# Patient Record
Sex: Female | Born: 1980
Health system: Southern US, Community
[De-identification: ages and names within clinical notes are randomized; demographics above are authoritative.]

## PROBLEM LIST (undated history)

## (undated) DIAGNOSIS — K589 Irritable bowel syndrome without diarrhea: Secondary | ICD-10-CM

## (undated) DIAGNOSIS — R748 Abnormal levels of other serum enzymes: Secondary | ICD-10-CM

## (undated) DIAGNOSIS — F419 Anxiety disorder, unspecified: Secondary | ICD-10-CM

## (undated) DIAGNOSIS — A630 Anogenital (venereal) warts: Secondary | ICD-10-CM

## (undated) DIAGNOSIS — C439 Malignant melanoma of skin, unspecified: Secondary | ICD-10-CM

## (undated) DIAGNOSIS — F32A Depression, unspecified: Secondary | ICD-10-CM

## (undated) DIAGNOSIS — E282 Polycystic ovarian syndrome: Secondary | ICD-10-CM

## (undated) DIAGNOSIS — C4371 Malignant melanoma of right lower limb, including hip: Secondary | ICD-10-CM

## (undated) DIAGNOSIS — E538 Deficiency of other specified B group vitamins: Secondary | ICD-10-CM

## (undated) DIAGNOSIS — Z8719 Personal history of other diseases of the digestive system: Secondary | ICD-10-CM

## (undated) DIAGNOSIS — B977 Papillomavirus as the cause of diseases classified elsewhere: Secondary | ICD-10-CM

## (undated) DIAGNOSIS — F329 Major depressive disorder, single episode, unspecified: Secondary | ICD-10-CM

## (undated) DIAGNOSIS — O24419 Gestational diabetes mellitus in pregnancy, unspecified control: Secondary | ICD-10-CM

## (undated) DIAGNOSIS — E559 Vitamin D deficiency, unspecified: Secondary | ICD-10-CM

## (undated) DIAGNOSIS — I10 Essential (primary) hypertension: Secondary | ICD-10-CM

## (undated) HISTORY — DX: Malignant melanoma of skin, unspecified: C43.9

## (undated) HISTORY — DX: Anogenital (venereal) warts: A63.0

## (undated) HISTORY — DX: Depression, unspecified: F32.A

## (undated) HISTORY — DX: Deficiency of other specified B group vitamins: E53.8

## (undated) HISTORY — DX: Papillomavirus as the cause of diseases classified elsewhere: B97.7

## (undated) HISTORY — DX: Polycystic ovarian syndrome: E28.2

## (undated) HISTORY — DX: Gestational diabetes mellitus in pregnancy, unspecified control: O24.419

## (undated) HISTORY — PX: WISDOM TOOTH EXTRACTION: SHX21

## (undated) HISTORY — DX: Vitamin D deficiency, unspecified: E55.9

## (undated) HISTORY — DX: Essential (primary) hypertension: I10

## (undated) HISTORY — DX: Abnormal levels of other serum enzymes: R74.8

## (undated) HISTORY — DX: Malignant melanoma of right lower limb, including hip: C43.71

## (undated) HISTORY — DX: Irritable bowel syndrome, unspecified: K58.9

## (undated) HISTORY — DX: Anxiety disorder, unspecified: F41.9

## (undated) HISTORY — DX: Personal history of other diseases of the digestive system: Z87.19

## (undated) HISTORY — DX: Major depressive disorder, single episode, unspecified: F32.9

---

## 2013-06-29 DIAGNOSIS — E559 Vitamin D deficiency, unspecified: Secondary | ICD-10-CM

## 2013-06-29 DIAGNOSIS — E538 Deficiency of other specified B group vitamins: Secondary | ICD-10-CM | POA: Insufficient documentation

## 2013-06-29 HISTORY — DX: Deficiency of other specified B group vitamins: E53.8

## 2013-06-29 HISTORY — DX: Vitamin D deficiency, unspecified: E55.9

## 2014-06-29 DIAGNOSIS — C4371 Malignant melanoma of right lower limb, including hip: Secondary | ICD-10-CM | POA: Insufficient documentation

## 2014-06-29 DIAGNOSIS — O24419 Gestational diabetes mellitus in pregnancy, unspecified control: Secondary | ICD-10-CM

## 2014-06-29 HISTORY — PX: MOLE REMOVAL: SHX2046

## 2014-06-29 HISTORY — DX: Malignant melanoma of right lower limb, including hip: C43.71

## 2014-06-29 HISTORY — DX: Gestational diabetes mellitus in pregnancy, unspecified control: O24.419

## 2014-10-08 LAB — HM HIV SCREENING LAB: HM HIV SCREENING: NEGATIVE

## 2014-12-18 LAB — LIPID PANEL
CHOLESTEROL: 234 — AB (ref 0–200)
HDL: 62 (ref 35–70)
LDL Cholesterol: 134
LDL/HDL RATIO: 2.2
Triglycerides: 190 — AB (ref 40–160)

## 2014-12-18 LAB — HEMOGLOBIN A1C: Hemoglobin A1C: 5.7

## 2015-09-11 LAB — CBC AND DIFFERENTIAL
HEMATOCRIT: 42 (ref 36–46)
HEMOGLOBIN: 13.8 (ref 12.0–16.0)
NEUTROS ABS: 5
PLATELETS: 303 (ref 150–399)
WBC: 7.5

## 2015-09-11 LAB — LIPID PANEL
CHOLESTEROL: 191 (ref 0–200)
HDL: 65 (ref 35–70)
LDL Cholesterol: 115
LDL/HDL RATIO: 1.8
Triglycerides: 55 (ref 40–160)

## 2015-09-11 LAB — BASIC METABOLIC PANEL
BUN: 13 (ref 4–21)
Creatinine: 0.6 (ref 0.5–1.1)
GLUCOSE: 110
POTASSIUM: 4.4 (ref 3.4–5.3)
SODIUM: 140 (ref 137–147)

## 2015-09-11 LAB — HEPATIC FUNCTION PANEL
ALT: 25 (ref 7–35)
AST: 22 (ref 13–35)
Alkaline Phosphatase: 122 (ref 25–125)
Bilirubin, Total: 0.4

## 2015-09-11 LAB — VITAMIN B12: VITAMIN B 12: 302

## 2015-09-11 LAB — VITAMIN D 25 HYDROXY (VIT D DEFICIENCY, FRACTURES): Vit D, 25-Hydroxy: 22.5

## 2015-09-11 LAB — TSH: TSH: 2.03 (ref 0.41–5.90)

## 2016-05-04 LAB — HM PAP SMEAR: HM PAP: NEGATIVE

## 2016-09-21 LAB — BASIC METABOLIC PANEL
BUN: 15 (ref 4–21)
Creatinine: 0.6 (ref 0.5–1.1)
Glucose: 108
Potassium: 4.4 (ref 3.4–5.3)
SODIUM: 138 (ref 137–147)

## 2016-09-21 LAB — HEPATIC FUNCTION PANEL
ALK PHOS: 225 — AB (ref 25–125)
ALT: 147 — AB (ref 7–35)
AST: 53 — AB (ref 13–35)
Bilirubin, Total: 0.4

## 2016-09-21 LAB — HEMOGLOBIN A1C: Hemoglobin A1C: 5.8

## 2016-09-21 LAB — CBC AND DIFFERENTIAL
HCT: 46 (ref 36–46)
HEMOGLOBIN: 14.6 (ref 12.0–16.0)
Neutrophils Absolute: 7
Platelets: 338 (ref 150–399)
WBC: 9.7

## 2016-09-21 LAB — VITAMIN D 25 HYDROXY (VIT D DEFICIENCY, FRACTURES): VIT D 25 HYDROXY: 24.9

## 2016-09-21 LAB — VITAMIN B12: VITAMIN B 12: 655

## 2016-09-21 LAB — LIPID PANEL
CHOLESTEROL: 202 — AB (ref 0–200)
HDL: 64 (ref 35–70)
LDL CALC: 119
LDl/HDL Ratio: 1.9
Triglycerides: 97 (ref 40–160)

## 2016-09-21 LAB — TSH: TSH: 1.33 (ref 0.41–5.90)

## 2016-10-05 ENCOUNTER — Other Ambulatory Visit: Payer: Self-pay | Admitting: Family Medicine

## 2016-10-05 DIAGNOSIS — R748 Abnormal levels of other serum enzymes: Secondary | ICD-10-CM

## 2016-10-09 ENCOUNTER — Ambulatory Visit
Admission: RE | Admit: 2016-10-09 | Discharge: 2016-10-09 | Disposition: A | Discharge status: Home / Self Care | Payer: BLUE CROSS/BLUE SHIELD | Source: Ambulatory Visit | Attending: Family Medicine | Admitting: Family Medicine

## 2016-10-09 DIAGNOSIS — R748 Abnormal levels of other serum enzymes: Secondary | ICD-10-CM

## 2016-10-09 HISTORY — DX: Abnormal levels of other serum enzymes: R74.8

## 2016-10-27 DIAGNOSIS — Z8719 Personal history of other diseases of the digestive system: Secondary | ICD-10-CM

## 2016-10-27 HISTORY — DX: Personal history of other diseases of the digestive system: Z87.19

## 2016-10-27 LAB — HEPATIC FUNCTION PANEL
ALT: 42 — AB (ref 7–35)
AST: 29 (ref 13–35)

## 2016-11-09 ENCOUNTER — Ambulatory Visit: Payer: BLUE CROSS/BLUE SHIELD | Admitting: Licensed Clinical Social Worker

## 2016-12-01 ENCOUNTER — Telehealth: Payer: Self-pay | Admitting: Family Medicine

## 2016-12-01 NOTE — Telephone Encounter (Signed)
FYI: Pending appt on 12/03/2016.

## 2016-12-01 NOTE — Telephone Encounter (Signed)
Scheduled new patient appt for thurs (patient is a travel nurse)  Patient complains of green sputum, cough, sinus congestion, jaw pain from congestion  Previously DX with a sinus infection and went through 10 day abx cycle. States it didn't help.

## 2016-12-03 ENCOUNTER — Ambulatory Visit (INDEPENDENT_AMBULATORY_CARE_PROVIDER_SITE_OTHER): Payer: BLUE CROSS/BLUE SHIELD

## 2016-12-03 ENCOUNTER — Encounter: Payer: Self-pay | Admitting: Physician Assistant

## 2016-12-03 ENCOUNTER — Ambulatory Visit (INDEPENDENT_AMBULATORY_CARE_PROVIDER_SITE_OTHER): Payer: BLUE CROSS/BLUE SHIELD | Admitting: Physician Assistant

## 2016-12-03 VITALS — BP 130/80 | HR 90 | Temp 98.9°F | Ht 69.0 in | Wt 231.0 lb

## 2016-12-03 DIAGNOSIS — R05 Cough: Secondary | ICD-10-CM

## 2016-12-03 DIAGNOSIS — Z9109 Other allergy status, other than to drugs and biological substances: Secondary | ICD-10-CM

## 2016-12-03 DIAGNOSIS — J0101 Acute recurrent maxillary sinusitis: Secondary | ICD-10-CM | POA: Diagnosis not present

## 2016-12-03 DIAGNOSIS — R059 Cough, unspecified: Secondary | ICD-10-CM

## 2016-12-03 MED ORDER — HYDROCOD POLST-CPM POLST ER 10-8 MG/5ML PO SUER: 5.0000 mL | Freq: Two times a day (BID) | ORAL | 0 refills | Status: DC | PRN

## 2016-12-03 MED ORDER — METHYLPREDNISOLONE ACETATE 80 MG/ML IJ SUSP
80.0000 mg | Freq: Once | INTRAMUSCULAR | Status: AC
  Administered 2016-12-03: 80 mg via INTRAMUSCULAR

## 2016-12-03 NOTE — Progress Notes (Signed)
Carolyn Gilmore is a 36 y.o. female here to establish care and recurrent sinus infection.  I acted as a Education administrator for [employer], PA-C Anselmo Pickler, LPN  History of Present Illness:   Chief Complaint  Patient presents with  . Establish Care  . Cough    x 4 weeks, expectorating green  . Nasal Congestion    green discharge  . Facial Pain    Acute Concerns: Upper respiratory infection -- was treated for sinusitis with 10 days of Augmentin on 11/13/16, which only provided about 2 days of relief, however she did complete entire prescription. She reports that her sinus/tooth pain has returned. No fevers. Coughing with green and brown mucus at times. Was started on Zyrtec daily and is still taking this. Has not had steroids. No history of asthma. Hx of PNA in college in 2005 and was kept in ED overnight for IV antibiotics. Not a smoker. Husband and son also sick. Mom diagnosed with bronchitis and PNA. No SOB.   Chronic Issues: None  Health Maintenance: Immunizations -- up to date Weight -- Weight: 231 lb (104.8 kg)   No flowsheet data found.  Other providers/specialists: Dermatology -- needs new provider here in [place] -- [facility] -- MD manages medications Ob-Gyn -- [facility] -- Dr. Gaetano Net  PMHx, SurgHx, SocialHx, Medications, and Allergies were reviewed in the Visit Navigator and updated as appropriate.  Current Medications:   Current Outpatient Prescriptions:  .  Ascorbic Acid (VITAMIN C) 1000 MG tablet, Take 2,000 mg by mouth daily., Disp: , Rfl:  .  Cholecalciferol (VITAMIN D) 2000 units CAPS, Take 3 capsules by mouth daily., Disp: , Rfl:  .  clonazePAM (KLONOPIN) 0.5 MG tablet, Take 0.25 mg by mouth as needed. , Disp: , Rfl:  .  DULoxetine (CYMBALTA) 30 MG capsule, Take 30 mg by mouth daily. , Disp: , Rfl:  .  DULoxetine (CYMBALTA) 60 MG capsule, Take 60 mg by mouth daily. , Disp: , Rfl:  .  erythromycin ophthalmic ointment, Place  1 application into both eyes 2 (two) times daily. , Disp: , Rfl:  .  ibuprofen (ADVIL,MOTRIN) 800 MG tablet, Take 800 mg by mouth 2 (two) times daily. , Disp: , Rfl:  .  Multiple Vitamin (MULTIVITAMIN) tablet, Take 1 tablet by mouth daily., Disp: , Rfl:  .  Omega-3 Fatty Acids (FISH OIL) 1000 MG CAPS, Take 1 capsule by mouth daily., Disp: , Rfl:  .  vitamin B-12 (CYANOCOBALAMIN) 1000 MCG tablet, Take 1,000 mcg by mouth 2 (two) times daily., Disp: , Rfl:  .  chlorpheniramine-HYDROcodone (TUSSIONEX PENNKINETIC ER) 10-8 MG/5ML SUER, Take 5 mLs by mouth every 12 (twelve) hours as needed for cough., Disp: 115 mL, Rfl: 0   Review of Systems:   Review of Systems  Constitutional: Negative for chills, fever, malaise/fatigue and weight loss.  HENT: Positive for congestion, sinus pain and sore throat. Negative for hearing loss.        Nasal congestion, green nasal discharge  Eyes: Negative for blurred vision.  Respiratory: Positive for cough and sputum production. Negative for shortness of breath.        Expectorating brown / green sputum.  Cardiovascular: Negative for chest pain, palpitations and leg swelling.  Gastrointestinal: Negative for abdominal pain, constipation, diarrhea, heartburn, nausea and vomiting.  Genitourinary: Negative for dysuria, frequency and urgency.  Musculoskeletal: Negative for back pain, myalgias and neck pain.  Skin: Negative for itching and rash.  Neurological: Positive for headaches. Negative  for dizziness, tingling, seizures and loss of consciousness.  Endo/Heme/Allergies: Negative for polydipsia.  Psychiatric/Behavioral: Positive for depression. Negative for hallucinations, memory loss, substance abuse and suicidal ideas. The patient is not nervous/anxious and does not have insomnia.        Hx of depression and pt takes cymbalta which is helping.    Vitals:   Vitals:   12/03/16 0944  BP: 130/80  Pulse: 90  Temp: 98.9 F (37.2 C)  TempSrc: Oral  SpO2: 96%   Weight: 231 lb (104.8 kg)  Height: 5\' 9"  (1.753 m)     Body mass index is 34.11 kg/m.  Physical Exam:   Physical Exam  Constitutional: She appears well-developed. She is cooperative.  Non-toxic appearance. She does not have a sickly appearance. She does not appear ill. No distress.  HENT:  Head: Normocephalic and atraumatic.  Right Ear: Tympanic membrane, external ear and ear canal normal. Tympanic membrane is not erythematous, not retracted and not bulging.  Left Ear: Tympanic membrane, external ear and ear canal normal. Tympanic membrane is not erythematous, not retracted and not bulging.  Nose: Right sinus exhibits maxillary sinus tenderness. Right sinus exhibits no frontal sinus tenderness. Left sinus exhibits maxillary sinus tenderness. Left sinus exhibits no frontal sinus tenderness.  Mouth/Throat: Uvula is midline. No posterior oropharyngeal edema or posterior oropharyngeal erythema. Tonsils are 1+ on the right. Tonsils are 1+ on the left. No tonsillar exudate.  Eyes: Conjunctivae and lids are normal.  Neck: Trachea normal.  Cardiovascular: Normal rate, regular rhythm, S1 normal, S2 normal and normal heart sounds.   Pulmonary/Chest: Effort normal and breath sounds normal. She has no decreased breath sounds. She has no wheezes. She has no rhonchi. She has no rales.  Lymphadenopathy:    She has no cervical adenopathy.  Neurological: She is alert.  Skin: Skin is warm, dry and intact.  Psychiatric: She has a normal mood and affect. Her speech is normal and behavior is normal.  Nursing note and vitals reviewed.   IMPRESSION: No acute cardiopulmonary disease.  Assessment and Plan:    [name] was seen today for establish care, cough, nasal congestion and facial pain.  Diagnoses and all orders for this visit:  Cough -     DG Chest 2 View; Future  Acute recurrent maxillary sinusitis -     methylPREDNISolone acetate (DEPO-MEDROL) injection 80 mg; Inject 1 mL (80 mg total) into  the muscle once.  Environmental allergies  Other orders -     chlorpheniramine-HYDROcodone (TUSSIONEX PENNKINETIC ER) 10-8 MG/5ML SUER; Take 5 mLs by mouth every 12 (twelve) hours as needed for cough.   Patient's chest x-ray without active cardiopulmonary disease. She is not having any fevers, and vital signs are currently stable. I believe that she is having some allergy symptoms from [place]. She has already completed one round of Augmentin. She was seen in injection of Depo-Medrol 80 mg today in the office and tolerated well. I'm also going to give her some Tussionex to help with her cough. I want her to continue Zyrtec and Mucinex. If she were to develop worsening symptoms such as fever, shortness of breath, or any other significant changes, I want her to be reevaluated since we can potentially reconsider adding a second antibiotic. Consider referral to allergy or ENT.  Marland Kitchen Reviewed expectations re: course of current medical issues. . Discussed self-management of symptoms. . Outlined signs and symptoms indicating need for more acute intervention. . Patient verbalized understanding and all questions were answered. Marland Kitchen See  orders for this visit as documented in the electronic medical record. . Patient received an After-Visit Summary.  CMA or LPN served as scribe during this visit. History, Physical, and Plan performed by medical provider. Documentation and orders reviewed and attested to.  Inda Coke, PA-C

## 2016-12-03 NOTE — Patient Instructions (Signed)
It was great meeting you today!  We will call you with the results of your xray and plan for care.   Increase fluids.  Rest.  Saline nasal spray.  Mucinex and zyrtec as directed.  Humidifier in bedroom.   Call or return to clinic if symptoms are not improving.

## 2016-12-14 NOTE — Telephone Encounter (Signed)
Clarisa Schools to  [facility] fax # [phone]. 12/14/16 PWR

## 2016-12-14 NOTE — Telephone Encounter (Signed)
Faxed ROI to [facility] [place] fax # [phone]. 12/14/16 PWR

## 2016-12-17 ENCOUNTER — Encounter: Payer: Self-pay | Admitting: Physician Assistant

## 2016-12-17 LAB — CHLAMYDIA PROBE AMP THINPREP
CHLAMYDIA TRACHOMATIS, NAA: NEGATIVE
NEISSERIA GONORRHOEAE, NAA: NEGATIVE

## 2016-12-18 ENCOUNTER — Encounter: Payer: Self-pay | Admitting: Physician Assistant

## 2016-12-31 ENCOUNTER — Encounter: Payer: Self-pay | Admitting: Physician Assistant

## 2016-12-31 LAB — PROTEIN, URINE, RANDOM: Protein, Ur: NEGATIVE

## 2016-12-31 LAB — VARICELLA ZOSTER ANTIBODY, IGG: Varicella zoster IgG: POSITIVE

## 2016-12-31 LAB — T4: THYROXINE (T4): 0.9

## 2016-12-31 LAB — T3, FREE: FREE T3: 3.4

## 2016-12-31 LAB — ESTIMATED GFR
GFR CALC NON AF AMER: 115
GFR CALC NON AF AMER: 139

## 2017-02-01 ENCOUNTER — Encounter: Payer: Self-pay | Admitting: Physician Assistant

## 2017-02-01 ENCOUNTER — Telehealth: Payer: Self-pay | Admitting: Physician Assistant

## 2017-02-01 ENCOUNTER — Ambulatory Visit (INDEPENDENT_AMBULATORY_CARE_PROVIDER_SITE_OTHER): Payer: BLUE CROSS/BLUE SHIELD | Admitting: Physician Assistant

## 2017-02-01 VITALS — BP 122/78 | HR 97 | Ht 69.0 in | Wt 234.6 lb

## 2017-02-01 DIAGNOSIS — K8020 Calculus of gallbladder without cholecystitis without obstruction: Secondary | ICD-10-CM

## 2017-02-01 DIAGNOSIS — F339 Major depressive disorder, recurrent, unspecified: Secondary | ICD-10-CM

## 2017-02-01 DIAGNOSIS — F419 Anxiety disorder, unspecified: Secondary | ICD-10-CM | POA: Diagnosis not present

## 2017-02-01 MED ORDER — DULOXETINE HCL 60 MG PO CPEP: 60.0000 mg | ORAL_CAPSULE | Freq: Every day | ORAL | 0 refills | Status: DC

## 2017-02-01 MED ORDER — DULOXETINE HCL 30 MG PO CPEP: 30.0000 mg | ORAL_CAPSULE | Freq: Every day | ORAL | 0 refills | Status: DC

## 2017-02-01 NOTE — Patient Instructions (Signed)
Consider establishing care with Trey Paula, our therapist. She can connect you with a psychiatrist.  Low-Fat Diet for Pancreatitis or Gallbladder Conditions A low-fat diet can be helpful if you have pancreatitis or a gallbladder condition. With these conditions, your pancreas and gallbladder have trouble digesting fats. A healthy eating plan with less fat will help rest your pancreas and gallbladder and reduce your symptoms. What do I need to know about this diet?  Eat a low-fat diet. ? Reduce your fat intake to less than 20-30% of your total daily calories. This is less than 50-60 g of fat per day. ? Remember that you need some fat in your diet. Ask your dietician what your daily goal should be. ? Choose nonfat and low-fat healthy foods. Look for the words "nonfat," "low fat," or "fat free." ? As a guide, look on the label and choose foods with less than 3 g of fat per serving. Eat only one serving.  Avoid alcohol.  Do not smoke. If you need help quitting, talk with your health care provider.  Eat small frequent meals instead of three large heavy meals. What foods can I eat? Grains Include healthy grains and starches such as potatoes, wheat bread, fiber-rich cereal, and brown rice. Choose whole grain options whenever possible. In adults, whole grains should account for 45-65% of your daily calories. Fruits and Vegetables Eat plenty of fruits and vegetables. Fresh fruits and vegetables add fiber to your diet. Meats and Other Protein Sources Eat lean meat such as chicken and pork. Trim any fat off of meat before cooking it. Eggs, fish, and beans are other sources of protein. In adults, these foods should account for 10-35% of your daily calories. Dairy Choose low-fat milk and dairy options. Dairy includes fat and protein, as well as calcium. Fats and Oils Limit high-fat foods such as fried foods, sweets, baked goods, sugary drinks. Other Creamy sauces and condiments, such as mayonnaise,  can add extra fat. Think about whether or not you need to use them, or use smaller amounts or low fat options. What foods are not recommended?  High fat foods, such as: ? [employer]. ? Ice cream. ? [place] toast. ? Sweet rolls. ? Pizza. ? Cheese bread. ? Foods covered with batter, butter, creamy sauces, or cheese. ? Fried foods. ? Sugary drinks and desserts.  Foods that cause gas or bloating This information is not intended to replace advice given to you by your health care provider. Make sure you discuss any questions you have with your health care provider. Document Released: 06/20/2013 Document Revised: 11/21/2015 Document Reviewed: 05/29/2013 Elsevier Interactive Patient Education  2017 [employer].

## 2017-02-01 NOTE — Telephone Encounter (Signed)
Please advise on this. Thank you. 

## 2017-02-01 NOTE — Telephone Encounter (Signed)
Please call patient and ask her when her next appointment with psychiatry is.  I have never seen her for anxiety and depression and would prefer for her to have an office visit with me prior to refilling this.  Inda Coke PA-C, 02/01/17

## 2017-02-01 NOTE — Progress Notes (Signed)
Carolyn Gilmore is a 36 y.o. female here for a new problem.  SCRIBE STATEMENT  History of Present Illness:   Chief Complaint  Patient presents with  . Abdominal Pain    has issue with gallbladdar trying a diet non greasy foods but still has issues with pain    HPI  Depression and anxiety -- has been on Cymbalta x 10 years, last visit with psych was 11/03/16. Called to reschedule her July appointment but appears to have had some miscommunication with the staff at her psychiatrist's office about whether or not she actually cancelled it -- was told that they are going to review her records and determine if they are going to refill it or if she will have to wait for another appointment which she has yet to schedule, however she is almost out of her medication. Takes 90 mg Cymbalta daily. May go up to 120 mg in the holidays or with life stressors. Denies any prior or current SI/HI.  Depression screen PHQ 2/9 02/01/2017  Decreased Interest 0  Down, Depressed, Hopeless 0  PHQ - 2 Score 0  Altered sleeping 1  Tired, decreased energy 1  Change in appetite 1  Feeling bad or failure about yourself  0  Trouble concentrating 0  Moving slowly or fidgety/restless 0  Suicidal thoughts 0  PHQ-9 Score 3   GAD 7 : Generalized Anxiety Score 02/01/2017  Nervous, Anxious, on Edge 1  Control/stop worrying 0  Worry too much - different things 0  Trouble relaxing 1  Restless 0  Easily annoyed or irritable 1  Afraid - awful might happen 0  Total GAD 7 Score 3    Gallstones -- 4 months ago her liver enzymes were found to be elevated. An abdominal ultrasound in April showed cholelithiasis. Was given a surgical referral and her appointment is in 2 weeks. She has had intermittent pain and nausea that is directly correlated to high fat food intake. Denies fevers, chills, vomiting, diarrhea, changes in bowels.   Lab Results  Component Value Date   ALT 42 (A) 10/27/2016   AST 29 10/27/2016   ALKPHOS 225 (A)  09/21/2016     Past Medical History:  Diagnosis Date  . Anxiety   . Depression   . Elevated liver enzymes 10/09/2016   RUQ ultrasound -- showed some gallstones  . Genital warts   . Gestational diabetes 2016  . HPV in female   . Hx of gallstones 10/2016  . Hypertension   . IBS (irritable bowel syndrome)   . Melanoma ([place])    R thigh  . Melanoma of thigh, right ([place]) 2016  . PCOS (polycystic ovarian syndrome)   . Vitamin B 12 deficiency 2015  . Vitamin D deficiency 2015     Social History   Social History  . Marital status: Married    Spouse name: N/A  . Number of children: N/A  . Years of education: N/A   Occupational History  . Not on file.   Social History Main Topics  . Smoking status: Never Smoker  . Smokeless tobacco: Never Used  . Alcohol use Yes     Comment: 2-3 wines or beer on weekend  . Drug use: No  . Sexual activity: Yes    Birth control/ protection: Condom   Other Topics Concern  . Not on file   Social History Narrative   Was living in [place] in 15-16 years   Now moving here to be close to dad  Married with 1 son    Past Surgical History:  Procedure Laterality Date  . MOLE REMOVAL N/A 2016   Right thigh, Melanoma    No family history on file.  Allergies  Allergen Reactions  . Bacitracin Other (See Comments)    Blisters and pustules    Current Medications:   Current Outpatient Prescriptions:  .  clonazePAM (KLONOPIN) 0.5 MG tablet, Take 0.25 mg by mouth as needed. , Disp: , Rfl:  .  DULoxetine (CYMBALTA) 30 MG capsule, Take 1 capsule (30 mg total) by mouth daily., Disp: 90 capsule, Rfl: 0 .  DULoxetine (CYMBALTA) 60 MG capsule, Take 1 capsule (60 mg total) by mouth daily., Disp: 90 capsule, Rfl: 0 .  ibuprofen (ADVIL,MOTRIN) 800 MG tablet, Take 800 mg by mouth 2 (two) times daily. , Disp: , Rfl:  .  Multiple Vitamin (MULTIVITAMIN) tablet, Take 1 tablet by mouth daily., Disp: , Rfl:  .  Ascorbic Acid (VITAMIN C) 1000 MG tablet, Take  2,000 mg by mouth daily., Disp: , Rfl:  .  chlorpheniramine-HYDROcodone (TUSSIONEX PENNKINETIC ER) 10-8 MG/5ML SUER, Take 5 mLs by mouth every 12 (twelve) hours as needed for cough. (Patient not taking: Reported on 02/01/2017), Disp: 115 mL, Rfl: 0 .  Cholecalciferol (VITAMIN D) 2000 units CAPS, Take 3 capsules by mouth daily., Disp: , Rfl:  .  erythromycin ophthalmic ointment, Place 1 application into both eyes 2 (two) times daily. , Disp: , Rfl:  .  Omega-3 Fatty Acids (FISH OIL) 1000 MG CAPS, Take 1 capsule by mouth daily., Disp: , Rfl:  .  vitamin B-12 (CYANOCOBALAMIN) 1000 MCG tablet, Take 1,000 mcg by mouth 2 (two) times daily., Disp: , Rfl:    Review of Systems:   Review of Systems  Constitutional: Negative for chills, fever, malaise/fatigue and weight loss.  Respiratory: Negative for shortness of breath.   Cardiovascular: Negative for chest pain and palpitations.  Gastrointestinal: Positive for abdominal pain and nausea. Negative for diarrhea, heartburn and vomiting.  Neurological: Negative for dizziness and headaches.  Psychiatric/Behavioral: Positive for depression. Negative for substance abuse and suicidal ideas. The patient is nervous/anxious.     Vitals:   Vitals:   02/01/17 1530  BP: 122/78  Pulse: 97  SpO2: 92%  Weight: 234 lb 9.6 oz (106.4 kg)  Height: 5\' 9"  (1.753 m)     Body mass index is 34.64 kg/m.  Physical Exam:   Physical Exam  Constitutional: She appears well-developed. She is cooperative.  Non-toxic appearance. She does not have a sickly appearance. She does not appear ill. No distress.  Cardiovascular: Normal rate, regular rhythm, S1 normal, S2 normal, normal heart sounds and normal pulses.   No LE edema  Pulmonary/Chest: Effort normal and breath sounds normal.  Abdominal: Normal appearance and bowel sounds are normal. There is no tenderness. There is no rigidity, no rebound, no guarding and negative Murphy's sign.  Neurological: She is alert. GCS eye  subscore is 4. GCS verbal subscore is 5. GCS motor subscore is 6.  Skin: Skin is warm, dry and intact.  Psychiatric: She has a normal mood and affect. Her speech is normal and behavior is normal.  Nursing note and vitals reviewed.   Assessment and Plan:    Carolyn Gilmore was seen today for abdominal pain.  Diagnoses and all orders for this visit:  Anxiety and Depression, recurrent (HCC) PHQ-9 is 3 and GAD-7 is 3 today. Well controlled on Cymbalta 90 mg. She is interested in changing psychiatrists as well as counselors,  provided her with Trey Paula' contact information. Refills provided for a total of 3 months.  Calculus of gallbladder without cholecystitis without obstruction No acute indications for urgent cholecystectomy at this time. She declined further labwork today to assess LFT's or WBC. She is aware of red flags. Plans to attend general surgery appointment in 2 weeks for elective cholecystectomy. Provided information on low-fat diet in the meantime. Avoid ETOH. Patient is agreeable to plan.  Other orders -     DULoxetine (CYMBALTA) 30 MG capsule; Take 1 capsule (30 mg total) by mouth daily. -     DULoxetine (CYMBALTA) 60 MG capsule; Take 1 capsule (60 mg total) by mouth daily.    . Reviewed expectations re: course of current medical issues. . Discussed self-management of symptoms. . Outlined signs and symptoms indicating need for more acute intervention. . Patient verbalized understanding and all questions were answered. . See orders for this visit as documented in the electronic medical record. . Patient received an After-Visit Summary.  CMA or LPN served as scribe during this visit. History, Physical, and Plan performed by medical provider. Documentation and orders reviewed and attested to.  Inda Coke, PA-C

## 2017-02-01 NOTE — Telephone Encounter (Signed)
The patient takes DULoxetine (CYMBALTA) 30 MG capsule and DULoxetine (CYMBALTA) 60 MG capsule fro her anxiety and depression. Her psych provider prescribes this medication and has told her that due to missing an appointment they will not refill until their office manager reviews the note. Patient is asking if [facility] can refill due to after 3 days of being out she gets very depressed. Call patient to advise if this is possible or not.

## 2017-02-01 NOTE — Telephone Encounter (Signed)
Spoke with the patient and she stated she was last seen in June by her psychiatrist but missed her appointment this month so she is unable to get refill for Cymbalta- patient agreed to come in to see Inda Coke, PA for a f/u visit before prescribing medication

## 2017-02-10 ENCOUNTER — Encounter: Payer: Self-pay | Admitting: Physician Assistant

## 2017-03-03 ENCOUNTER — Ambulatory Visit: Payer: Self-pay | Admitting: Surgery

## 2017-03-03 NOTE — H&P (Signed)
Carolyn Gilmore 03/03/2017 1:34 PM Location: [facility] Patient #: [id number] DOB: 03/27/1981 Married / Language: English / Race: Asian Female  History of Present Illness (Ewen Varnell A. Kae Heller MD; 03/03/2017 1:48 PM) Patient words: This is a very pleasant and relatively healthy 36 year old woman who works as an Warden/ranger at [employer] who presents with biliary colic. She initially was found to have elevated LFTs in March of this year on routine labs with her primary care provider. Alkaline phosphatase is 225, AST 53, ALT 147, bilirubin 0.4. CBC was normal and her lateral x-ray otherwise normal. This provided an ultrasound which was performed in March of this year which showed "either one large or multiple confluent echogenic foci which move and shadow consistent with cholelithiasis", no cholecystitis, common bile duct was 2 mm, no focal liver lesions were present and no mention of steatosis. As she was asymptomatic at that time she was advised to continue monitoring, however about a month ago she began to have abdominal pain which she described as diffuse and radiating around to the right back, associated with nausea but no emesis. It is occasionally triggered by eating. This happens once or twice a week. She then was referred here for further evaluation. She is also interested in learning about bariatric surgery.  The patient is a 36 year old female.   Past Surgical History ([name], [place]; 03/03/2017 1:34 PM) Colon Polyp Removal - Colonoscopy  Diagnostic Studies History ([name], [place]; 03/03/2017 1:34 PM) Colonoscopy >10 years ago Mammogram never Pap Smear 1-5 years ago  Allergies ([name], [place]; 03/03/2017 1:36 PM) No Known Drug Allergies 03/03/2017 Allergies Reconciled  Medication History ([name], [place]; 03/03/2017 1:36 PM) ClonazePAM (0.5MG  Tablet, Oral) Active. DULoxetine HCl (60MG  Capsule DR Part, Oral) Active. Medications Reconciled  Social  History ([name], [place]; 03/03/2017 1:34 PM) Alcohol use Moderate alcohol use. Caffeine use Carbonated beverages. No drug use Tobacco use Never smoker.  Family History ([name], [address]; 03/03/2017 1:34 PM) Alcohol Abuse Brother. Colon Polyps Father. Depression Brother. Diabetes Mellitus Father, Mother. Hypertension Father, Mother. Ischemic Bowel Disease Brother. Prostate Cancer Father. Respiratory Condition Mother.  Pregnancy / Birth History ([name], [place]; 03/03/2017 1:34 PM) Age at menarche 38 years. Contraceptive History Intrauterine device. Gravida 1 Irregular periods Length (months) of breastfeeding >24 Maternal age 40-35 Para 1  Other Problems ([name], [place]; 03/03/2017 1:34 PM) Anxiety Disorder Cholelithiasis Depression Melanoma     Review of Systems ([name]; 03/03/2017 1:34 PM) General Not Present- Appetite Loss, Chills, Fatigue, Fever, Night Sweats, Weight Gain and Weight Loss. HEENT Present- Seasonal Allergies, Sinus Pain, Sore Throat and Wears glasses/contact lenses. Not Present- Earache, Hearing Loss, Hoarseness, Nose Bleed, Oral Ulcers, Ringing in the Ears, Visual Disturbances and Yellow Eyes. Respiratory Present- Chronic Cough and Snoring. Not Present- Bloody sputum, Difficulty Breathing and Wheezing. Breast Not Present- Breast Mass, Breast Pain, Nipple Discharge and Skin Changes. Cardiovascular Present- Palpitations. Not Present- Chest Pain, Difficulty Breathing Lying Down, Leg Cramps, Rapid Heart Rate, Shortness of Breath and Swelling of Extremities. Gastrointestinal Present- Abdominal Pain, Bloating, Change in Bowel Habits, Excessive gas, Indigestion, Nausea and Vomiting. Not Present- Bloody Stool, Chronic diarrhea, Constipation, Difficulty Swallowing, Gets full quickly at meals, Hemorrhoids and Rectal Pain. Female Genitourinary Present- Urgency. Not Present- Frequency, Nocturia, Painful Urination and  Pelvic Pain. Neurological Not Present- Decreased Memory, Fainting, Headaches, Numbness, Seizures, Tingling, Tremor, Trouble walking and Weakness. Psychiatric Present- Anxiety and Depression. Not Present- Bipolar, Change  in Sleep Pattern, Fearful and Frequent crying. Hematology Present- Easy Bruising and Persistent Infections. Not Present- Blood Thinners, Excessive bleeding, Gland problems and HIV.  Vitals ([name]; 03/03/2017 1:36 PM) 03/03/2017 1:35 PM Weight: 241 lb Height: 69in Body Surface Area: 2.24 m Body Mass Index: 35.59 kg/m  Temp.: 97.13F  Pulse: 103 (Regular)  P.OX: 98% (Room air) BP: 122/74 (Sitting, Left Arm, Standard)      Physical Exam (Kyler Lerette A. Kae Heller MD; 03/03/2017 1:50 PM)  General Note: She is alert and well-appearing  Integumentary Note: Skin is warm and dry  Eye Note: No scleral icterus. Extraocular movements intact.  Chest and Lung Exam Note: Labored respirations. Symmetrical air entry.  Cardiovascular Note: Regular rate and rhythm. No pedal edema, palpable pedal pulses bilaterally.  Abdomen Note: Soft, obese, nontender. No surgical scars, no palpable mass organomegaly or hernia.  Neurologic Note: Grossly intact. Normal gait  Neuropsychiatric Note: Normal mood and affect, appropriate insight  Musculoskeletal Note: Straight symmetrical throughout. No deformity    Assessment & Plan (Shadoe Cryan A. Kae Heller MD; 03/03/2017 7:32 PM)  BILIARY COLIC (K02.54) Story: Her laparoscopic cholecystectomy. We discussed risks of bleeding, infection, pain, scarring, and abdominal injury specifically to the common bile duct and sequelae, conversion to open surgery, and failure to resolve symptoms. We also discussed the risks of general anesthesia including blood clots, heart attack, stroke, pneumonia. She is expressed understanding and desires to proceed we will get her scheduled in the coming weeks.

## 2017-03-04 ENCOUNTER — Telehealth: Payer: Self-pay | Admitting: Physician Assistant

## 2017-03-04 NOTE — Telephone Encounter (Signed)
Please advise 

## 2017-03-04 NOTE — Telephone Encounter (Signed)
Noted  

## 2017-03-04 NOTE — Telephone Encounter (Signed)
Sched acute visit w/ Sam on 09/07 at 09:30am for cough + congestion.  -LL

## 2017-03-05 ENCOUNTER — Ambulatory Visit (INDEPENDENT_AMBULATORY_CARE_PROVIDER_SITE_OTHER): Payer: BLUE CROSS/BLUE SHIELD | Admitting: Physician Assistant

## 2017-03-05 ENCOUNTER — Ambulatory Visit (INDEPENDENT_AMBULATORY_CARE_PROVIDER_SITE_OTHER): Payer: BLUE CROSS/BLUE SHIELD | Admitting: Psychology

## 2017-03-05 ENCOUNTER — Encounter: Payer: Self-pay | Admitting: Physician Assistant

## 2017-03-05 VITALS — BP 126/78 | HR 95 | Temp 98.6°F | Ht 69.0 in | Wt 242.2 lb

## 2017-03-05 DIAGNOSIS — J0111 Acute recurrent frontal sinusitis: Secondary | ICD-10-CM | POA: Diagnosis not present

## 2017-03-05 DIAGNOSIS — F330 Major depressive disorder, recurrent, mild: Secondary | ICD-10-CM | POA: Diagnosis not present

## 2017-03-05 DIAGNOSIS — Z1371 Encounter for nonprocreative screening for genetic disease carrier status: Secondary | ICD-10-CM | POA: Diagnosis not present

## 2017-03-05 MED ORDER — AMOXICILLIN-POT CLAVULANATE 875-125 MG PO TABS: 1.0000 | ORAL_TABLET | Freq: Two times a day (BID) | ORAL | 0 refills | Status: AC

## 2017-03-05 MED ORDER — PREDNISONE 20 MG PO TABS: 40.0000 mg | ORAL_TABLET | Freq: Every day | ORAL | 0 refills | Status: DC

## 2017-03-05 MED ORDER — PSEUDOEPH-BROMPHEN-DM 30-2-10 MG/5ML PO SYRP: 2.5000 mL | ORAL_SOLUTION | Freq: Every evening | ORAL | 0 refills | Status: DC | PRN

## 2017-03-05 NOTE — Patient Instructions (Signed)
It was great to see you!  Push fluids and get plenty of rest.  Take the Bromfed cough syrup as needed.  Start the antibiotic, take with food.  I have also given you a script for a steroid if you decide to take it. Avoid taking too close to bedtime as it may cause insomnia.

## 2017-03-05 NOTE — Progress Notes (Signed)
Carolyn Gilmore is a 36 y.o. female here for a cough and congestion.  I acted as a Education administrator for [employer], PA-C Anselmo Pickler, LPN  History of Present Illness:   Chief Complaint  Patient presents with  . Cough    x 1.5 weeks    Cough  This is a new problem. Episode onset: x 1.5 weeks. The problem has been gradually worsening. The problem occurs every few hours (worse in the morning and at night). The cough is productive of sputum (tan / green sputum). Associated symptoms include chills, ear pain, nasal congestion, postnasal drip and a sore throat. Pertinent negatives include no eye redness, fever, headaches, heartburn, rash, shortness of breath, weight loss or wheezing. Associated symptoms comments: Sinus pressure, eyes are crusted shut in the morning yellow drainage.. The symptoms are aggravated by lying down. Risk factors for lung disease include occupational exposure. Treatments tried: Mucinex with cough, sudaphed, Ibuprofen. The treatment provided mild relief.   Son had pink eye about 1 month ago. Is worried that she has been sick more frequently than usual since moving from [place].  Genetic disease carrier status testing, female -- son diagnosed with Tuberous Sclerosis. 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 Patient would like to talk about testing today.    Past Medical History:  Diagnosis Date  . Anxiety   . Depression   . Elevated liver enzymes 10/09/2016   RUQ ultrasound -- showed some gallstones  . Genital warts   . Gestational diabetes 2016  . HPV in female   . Hx of gallstones 10/2016  . Hypertension   . IBS (irritable bowel syndrome)   . Melanoma ([place])    R thigh  . Melanoma of thigh, right ([place]) 2016  . PCOS (polycystic ovarian syndrome)   . Vitamin B 12 deficiency 2015  . Vitamin D deficiency 2015     Social History   Social History  . Marital status: Married    Spouse name: N/A  . Number of children:  N/A  . Years of education: N/A   Occupational History  . Not on file.   Social History Main Topics  . Smoking status: Never Smoker  . Smokeless tobacco: Never Used  . Alcohol use Yes     Comment: 2-3 wines or beer on weekend  . Drug use: No  . Sexual activity: Yes    Birth control/ protection: Condom   Other Topics Concern  . Not on file   Social History Narrative   Was living in [place] in 15-16 years   Now moving here to be close to dad   Married with 1 son    Past Surgical History:  Procedure Laterality Date  . MOLE REMOVAL N/A 2016   Right thigh, Melanoma    No family history on file.  Allergies  Allergen Reactions  . Bacitracin Other (See Comments)    Blisters and pustules    Current Medications:   Current Outpatient Prescriptions:  .  Ascorbic Acid (VITAMIN C) 1000 MG tablet, Take 2,000 mg by mouth daily., Disp: , Rfl:  .  Cholecalciferol (VITAMIN D) 2000 units CAPS, Take 3 capsules by mouth daily., Disp: , Rfl:  .  clonazePAM (KLONOPIN) 0.5 MG tablet, Take 0.25 mg by mouth as needed. , Disp: , Rfl:  .  DULoxetine (CYMBALTA) 30 MG capsule, Take 1 capsule (30 mg total) by mouth daily., Disp: 90 capsule, Rfl: 0 .  DULoxetine (  CYMBALTA) 60 MG capsule, Take 1 capsule (60 mg total) by mouth daily., Disp: 90 capsule, Rfl: 0 .  Multiple Vitamin (MULTIVITAMIN) tablet, Take 1 tablet by mouth daily., Disp: , Rfl:  .  Omega-3 Fatty Acids (FISH OIL) 1000 MG CAPS, Take 1 capsule by mouth daily., Disp: , Rfl:  .  vitamin B-12 (CYANOCOBALAMIN) 1000 MCG tablet, Take 1,000 mcg by mouth 2 (two) times daily., Disp: , Rfl:  .  amoxicillin-clavulanate (AUGMENTIN) 875-125 MG tablet, Take 1 tablet by mouth 2 (two) times daily., Disp: 20 tablet, Rfl: 0 .  brompheniramine-pseudoephedrine-DM 30-2-10 MG/5ML syrup, Take 2.5 mLs by mouth at bedtime as needed., Disp: 120 mL, Rfl: 0 .  chlorpheniramine-HYDROcodone (TUSSIONEX PENNKINETIC ER) 10-8 MG/5ML SUER, Take 5 mLs by mouth every 12 (twelve)  hours as needed for cough. (Patient not taking: Reported on 02/01/2017), Disp: 115 mL, Rfl: 0 .  predniSONE (DELTASONE) 20 MG tablet, Take 2 tablets (40 mg total) by mouth daily., Disp: 10 tablet, Rfl: 0   Review of Systems:   Review of Systems  Constitutional: Positive for chills and malaise/fatigue. Negative for fever and weight loss.  HENT: Positive for ear pain, postnasal drip and sore throat.   Eyes: Positive for discharge. Negative for redness.  Respiratory: Positive for cough. Negative for shortness of breath and wheezing.   Gastrointestinal: Negative for abdominal pain, heartburn, nausea and vomiting.  Genitourinary: Negative for dysuria.  Musculoskeletal: Negative for neck pain.  Skin: Negative for itching and rash.  Neurological: Negative for dizziness, tingling, tremors and headaches.    Vitals:   Vitals:   03/05/17 0941  BP: 126/78  Pulse: 95  Temp: 98.6 F (37 C)  TempSrc: Oral  SpO2: 97%  Weight: 242 lb 4 oz (109.9 kg)  Height: 5\' 9"  (1.753 m)     Body mass index is 35.77 kg/m.  Physical Exam:   Physical Exam  Constitutional: She appears well-developed. She is cooperative.  Non-toxic appearance. She does not have a sickly appearance. She does not appear ill. No distress.  HENT:  Head: Normocephalic and atraumatic.  Right Ear: Tympanic membrane, external ear and ear canal normal. Tympanic membrane is not erythematous, not retracted and not bulging.  Left Ear: Tympanic membrane, external ear and ear canal normal. Tympanic membrane is not erythematous, not retracted and not bulging.  Nose: Mucosal edema and rhinorrhea present. Right sinus exhibits frontal sinus tenderness. Right sinus exhibits no maxillary sinus tenderness. Left sinus exhibits frontal sinus tenderness. Left sinus exhibits no maxillary sinus tenderness.  Mouth/Throat: Uvula is midline. No posterior oropharyngeal edema or posterior oropharyngeal erythema.  Eyes: Conjunctivae and lids are normal.   Neck: Trachea normal.  Cardiovascular: Normal rate, regular rhythm, S1 normal, S2 normal, normal heart sounds and normal pulses.   No LE edema  Pulmonary/Chest: Effort normal and breath sounds normal. She has no decreased breath sounds. She has no wheezes. She has no rhonchi. She has no rales.  Lymphadenopathy:    She has no cervical adenopathy.  Neurological: She is alert. GCS eye subscore is 4. GCS verbal subscore is 5. GCS motor subscore is 6.  Skin: Skin is warm, dry and intact.  Psychiatric: She has a normal mood and affect. Her speech is normal and behavior is normal.  Nursing note and vitals reviewed.   Assessment and Plan:    Jaleiah was seen today for cough.  Diagnoses and all orders for this visit:  Acute recurrent frontal sinusitis Most recent episode of sinusitis in June. She is  concerned about frequency of illnesses as she was rarely sick in [place]. Start Augmentin today. Bromfed for cough as needed. Warm compresses for eyes. I have also given patient a short prescription for 40 mg prednisone x 5 days if needed to help with her cough/sinus pressure if needed, she will hold for now and start this if needed. Follow-up if symptoms worsen or persist despite treatment. If she has another bout of sinusitis, she is interested in referral to ENT.  Genetic disease carrier status testing, female For best coordination of care, as well as for further, specialized recommendations that may result from positive screening, will send patient to genetic counselor.  Other orders -     brompheniramine-pseudoephedrine-DM 30-2-10 MG/5ML syrup; Take 2.5 mLs by mouth at bedtime as needed. -     amoxicillin-clavulanate (AUGMENTIN) 875-125 MG tablet; Take 1 tablet by mouth 2 (two) times daily. -     predniSONE (DELTASONE) 20 MG tablet; Take 2 tablets (40 mg total) by mouth daily.    . Reviewed expectations re: course of current medical issues. . Discussed self-management of symptoms. . Outlined  signs and symptoms indicating need for more acute intervention. . Patient verbalized understanding and all questions were answered. . See orders for this visit as documented in the electronic medical record. . Patient received an After-Visit Summary.  CMA or LPN served as scribe during this visit. History, Physical, and Plan performed by medical provider. Documentation and orders reviewed and attested to.  Inda Coke, PA-C

## 2017-03-16 NOTE — Pre-Procedure Instructions (Signed)
Julianny Milstein  03/16/2017      [facility] - [address], [address] [place] [address] Phone: [phone] Fax: [phone]    Your procedure is scheduled on March 19, 2017.  Report to [facility] Admitting at 800 AM.  Call this number if you have problems the morning of surgery:  [phone]   Remember:  Do not eat food or drink liquids after midnight.  Take these medicines the morning of surgery with A SIP OF WATER clonazepam (klonopin)-if needed, duloxetine (cymbalta).  7 days prior to surgery STOP taking any Aspirin, Aleve, Naproxen, Ibuprofen, Motrin, Advil, Goody's, BC's, all herbal medications, fish oil, and all vitamins   Do not wear jewelry, make-up or nail polish.  Do not wear lotions, powders, or perfumes, or deoderant.  Do not shave 48 hours prior to surgery.    Do not bring valuables to the hospital.  [facility] is not responsible for any belongings or valuables.  Contacts, dentures or bridgework may not be worn into surgery.  Leave your suitcase in the car.  After surgery it may be brought to your room.  For patients admitted to the hospital, discharge time will be determined by your treatment team.  Patients discharged the day of surgery will not be allowed to drive home.   Special instructions:  [place]- Preparing For Surgery  Before surgery, you can play an important role. Because skin is not sterile, your skin needs to be as free of germs as possible. You can reduce the number of germs on your skin by washing with CHG (chlorahexidine gluconate) Soap before surgery.  CHG is an antiseptic cleaner which kills germs and bonds with the skin to continue killing germs even after washing.  Please do not use if you have an allergy to CHG or antibacterial soaps. If your skin becomes reddened/irritated stop using the CHG.  Do not shave (including legs and  underarms) for at least 48 hours prior to first CHG shower. It is OK to shave your face.  Please follow these instructions carefully.   1. Shower the NIGHT BEFORE SURGERY and the MORNING OF SURGERY with CHG.   2. If you chose to wash your hair, wash your hair first as usual with your normal shampoo.  3. After you shampoo, rinse your hair and body thoroughly to remove the shampoo.  4. Use CHG as you would any other liquid soap. You can apply CHG directly to the skin and wash gently with a scrungie or a clean washcloth.   5. Apply the CHG Soap to your body ONLY FROM THE NECK DOWN.  Do not use on open wounds or open sores. Avoid contact with your eyes, ears, mouth and genitals (private parts). Wash genitals (private parts) with your normal soap.  6. Wash thoroughly, paying special attention to the area where your surgery will be performed.  7. Thoroughly rinse your body with warm water from the neck down.  8. DO NOT shower/wash with your normal soap after using and rinsing off the CHG Soap.  9. Pat yourself dry with a CLEAN TOWEL.   10. Wear CLEAN PAJAMAS   11. Place CLEAN SHEETS on your bed the night of your first shower and DO NOT SLEEP WITH PETS.    Day of Surgery: Do not apply any deodorants/lotions. Please wear clean clothes to the hospital/surgery center.  Please read over the following fact sheets that you were given. Pain Booklet, Coughing and Deep Breathing and Surgical Site Infection Prevention

## 2017-03-17 ENCOUNTER — Emergency Department (HOSPITAL_COMMUNITY)
Admission: EM | Admit: 2017-03-17 | Discharge: 2017-03-17 | Discharge status: Against Medical Advice | Payer: BLUE CROSS/BLUE SHIELD | Attending: Emergency Medicine | Admitting: Emergency Medicine

## 2017-03-17 ENCOUNTER — Encounter (HOSPITAL_COMMUNITY): Payer: Self-pay

## 2017-03-17 ENCOUNTER — Encounter (HOSPITAL_COMMUNITY)
Admission: RE | Admit: 2017-03-17 | Discharge: 2017-03-17 | Disposition: A | Discharge status: Home / Self Care | Payer: BLUE CROSS/BLUE SHIELD | Source: Ambulatory Visit | Attending: Surgery | Admitting: Surgery

## 2017-03-17 DIAGNOSIS — Z5321 Procedure and treatment not carried out due to patient leaving prior to being seen by health care provider: Secondary | ICD-10-CM | POA: Diagnosis not present

## 2017-03-17 DIAGNOSIS — I10 Essential (primary) hypertension: Secondary | ICD-10-CM | POA: Diagnosis not present

## 2017-03-17 DIAGNOSIS — K8064 Calculus of gallbladder and bile duct with chronic cholecystitis without obstruction: Secondary | ICD-10-CM | POA: Diagnosis not present

## 2017-03-17 DIAGNOSIS — Z85820 Personal history of malignant melanoma of skin: Secondary | ICD-10-CM | POA: Diagnosis not present

## 2017-03-17 DIAGNOSIS — F329 Major depressive disorder, single episode, unspecified: Secondary | ICD-10-CM | POA: Diagnosis not present

## 2017-03-17 DIAGNOSIS — E119 Type 2 diabetes mellitus without complications: Secondary | ICD-10-CM | POA: Diagnosis not present

## 2017-03-17 DIAGNOSIS — F419 Anxiety disorder, unspecified: Secondary | ICD-10-CM | POA: Diagnosis not present

## 2017-03-17 DIAGNOSIS — K8010 Calculus of gallbladder with chronic cholecystitis without obstruction: Secondary | ICD-10-CM | POA: Diagnosis present

## 2017-03-17 DIAGNOSIS — R109 Unspecified abdominal pain: Secondary | ICD-10-CM | POA: Diagnosis not present

## 2017-03-17 DIAGNOSIS — Z79899 Other long term (current) drug therapy: Secondary | ICD-10-CM | POA: Diagnosis not present

## 2017-03-17 LAB — COMPREHENSIVE METABOLIC PANEL
ALBUMIN: 4.2 g/dL (ref 3.5–5.0)
ALK PHOS: 88 U/L (ref 38–126)
ALT: 56 U/L — ABNORMAL HIGH (ref 14–54)
ANION GAP: 9 (ref 5–15)
AST: 30 U/L (ref 15–41)
BILIRUBIN TOTAL: 0.5 mg/dL (ref 0.3–1.2)
BUN: 9 mg/dL (ref 6–20)
CALCIUM: 9.3 mg/dL (ref 8.9–10.3)
CO2: 23 mmol/L (ref 22–32)
Chloride: 108 mmol/L (ref 101–111)
Creatinine, Ser: 0.72 mg/dL (ref 0.44–1.00)
Glucose, Bld: 158 mg/dL — ABNORMAL HIGH (ref 65–99)
POTASSIUM: 4.2 mmol/L (ref 3.5–5.1)
Sodium: 140 mmol/L (ref 135–145)
TOTAL PROTEIN: 7 g/dL (ref 6.5–8.1)

## 2017-03-17 LAB — CBC
HEMATOCRIT: 43.5 % (ref 36.0–46.0)
HEMOGLOBIN: 14.4 g/dL (ref 12.0–15.0)
MCH: 28.9 pg (ref 26.0–34.0)
MCHC: 33.1 g/dL (ref 30.0–36.0)
MCV: 87.3 fL (ref 78.0–100.0)
Platelets: 296 10*3/uL (ref 150–400)
RBC: 4.98 MIL/uL (ref 3.87–5.11)
RDW: 12.9 % (ref 11.5–15.5)
WBC: 9.3 10*3/uL (ref 4.0–10.5)

## 2017-03-17 LAB — PREGNANCY, URINE: PREG TEST UR: NEGATIVE

## 2017-03-17 LAB — LIPASE, BLOOD: Lipase: 29 U/L (ref 11–51)

## 2017-03-17 MED ORDER — OXYCODONE-ACETAMINOPHEN 5-325 MG PO TABS
1.0000 | ORAL_TABLET | Freq: Once | ORAL | Status: AC
Start: 2017-03-17 — End: 2017-03-17
  Administered 2017-03-17: 1 via ORAL

## 2017-03-17 MED ORDER — ONDANSETRON 4 MG PO TBDP
4.0000 mg | ORAL_TABLET | Freq: Once | ORAL | Status: AC | PRN
  Administered 2017-03-17: 4 mg via ORAL

## 2017-03-17 MED ORDER — ONDANSETRON 4 MG PO TBDP
ORAL_TABLET | ORAL | Status: AC
  Filled 2017-03-17: qty 1

## 2017-03-17 MED ORDER — OXYCODONE-ACETAMINOPHEN 5-325 MG PO TABS
ORAL_TABLET | ORAL | Status: AC
  Filled 2017-03-17: qty 1

## 2017-03-17 NOTE — Progress Notes (Signed)
PCP - Inda Coke  Cardiologist - denies  Chest x-ray - not needed EKG - DOS Stress Test - denies ECHO - denies Cardiac Cath - denies     Patient denies shortness of breath, fever, cough and chest pain at PAT appointment   Patient verbalized understanding of instructions that were given to them at the PAT appointment. Patient was also instructed that they will need to review over the PAT instructions again at home before surgery.

## 2017-03-17 NOTE — ED Notes (Signed)
Pt states that she feels better and that she is going to leave after she spoke with her surgeon.

## 2017-03-17 NOTE — ED Triage Notes (Signed)
Pt states that she is scheduled to have her gallbladder removed on Friday and has her preop today, pain is unbearable, denies n/v.

## 2017-03-19 ENCOUNTER — Ambulatory Visit (HOSPITAL_COMMUNITY)
Admission: RE | Admit: 2017-03-19 | Discharge: 2017-03-19 | Disposition: A | Discharge status: Home / Self Care | Payer: BLUE CROSS/BLUE SHIELD | Source: Ambulatory Visit | Attending: Surgery | Admitting: Surgery

## 2017-03-19 ENCOUNTER — Ambulatory Visit (HOSPITAL_COMMUNITY): Payer: BLUE CROSS/BLUE SHIELD | Admitting: Anesthesiology

## 2017-03-19 ENCOUNTER — Encounter (HOSPITAL_COMMUNITY): Payer: Self-pay | Admitting: *Deleted

## 2017-03-19 ENCOUNTER — Encounter (HOSPITAL_COMMUNITY)
Admission: RE | Disposition: A | Discharge status: Home / Self Care | Payer: Self-pay | Source: Ambulatory Visit | Attending: Surgery

## 2017-03-19 DIAGNOSIS — E119 Type 2 diabetes mellitus without complications: Secondary | ICD-10-CM | POA: Diagnosis not present

## 2017-03-19 DIAGNOSIS — Z79899 Other long term (current) drug therapy: Secondary | ICD-10-CM | POA: Insufficient documentation

## 2017-03-19 DIAGNOSIS — F419 Anxiety disorder, unspecified: Secondary | ICD-10-CM | POA: Insufficient documentation

## 2017-03-19 DIAGNOSIS — F329 Major depressive disorder, single episode, unspecified: Secondary | ICD-10-CM | POA: Insufficient documentation

## 2017-03-19 DIAGNOSIS — Z85820 Personal history of malignant melanoma of skin: Secondary | ICD-10-CM | POA: Insufficient documentation

## 2017-03-19 DIAGNOSIS — K8064 Calculus of gallbladder and bile duct with chronic cholecystitis without obstruction: Secondary | ICD-10-CM | POA: Diagnosis not present

## 2017-03-19 DIAGNOSIS — I10 Essential (primary) hypertension: Secondary | ICD-10-CM | POA: Diagnosis not present

## 2017-03-19 HISTORY — PX: CHOLECYSTECTOMY: SHX55

## 2017-03-19 SURGERY — LAPAROSCOPIC CHOLECYSTECTOMY
Anesthesia: General

## 2017-03-19 MED ORDER — SCOPOLAMINE 1 MG/3DAYS TD PT72
MEDICATED_PATCH | TRANSDERMAL | Status: DC | PRN
  Administered 2017-03-19: 1 via TRANSDERMAL

## 2017-03-19 MED ORDER — MIDAZOLAM HCL 5 MG/5ML IJ SOLN
INTRAMUSCULAR | Status: DC | PRN
  Administered 2017-03-19: 2 mg via INTRAVENOUS

## 2017-03-19 MED ORDER — KETOROLAC TROMETHAMINE 30 MG/ML IJ SOLN
INTRAMUSCULAR | Status: DC | PRN
  Administered 2017-03-19: 30 mg via INTRAVENOUS

## 2017-03-19 MED ORDER — ONDANSETRON 4 MG PO TBDP: 4.0000 mg | ORAL_TABLET | Freq: Three times a day (TID) | ORAL | 0 refills | Status: DC | PRN

## 2017-03-19 MED ORDER — LIDOCAINE 2% (20 MG/ML) 5 ML SYRINGE
INTRAMUSCULAR | Status: AC
  Filled 2017-03-19: qty 5

## 2017-03-19 MED ORDER — GABAPENTIN 300 MG PO CAPS
ORAL_CAPSULE | ORAL | Status: AC
  Administered 2017-03-19: 300 mg via ORAL
  Filled 2017-03-19: qty 1

## 2017-03-19 MED ORDER — CEFAZOLIN SODIUM-DEXTROSE 2-4 GM/100ML-% IV SOLN
INTRAVENOUS | Status: AC
  Filled 2017-03-19: qty 100

## 2017-03-19 MED ORDER — BUPIVACAINE-EPINEPHRINE (PF) 0.25% -1:200000 IJ SOLN
INTRAMUSCULAR | Status: AC
  Filled 2017-03-19: qty 30

## 2017-03-19 MED ORDER — BUPIVACAINE-EPINEPHRINE 0.25% -1:200000 IJ SOLN
INTRAMUSCULAR | Status: DC | PRN
  Administered 2017-03-19: 30 mL

## 2017-03-19 MED ORDER — SODIUM CHLORIDE 0.9 % IR SOLN
Status: DC | PRN
  Administered 2017-03-19: 1000 mL

## 2017-03-19 MED ORDER — OXYCODONE HCL 5 MG PO TABS: 5.0000 mg | ORAL_TABLET | Freq: Once | ORAL | Status: DC | PRN

## 2017-03-19 MED ORDER — LIDOCAINE HCL (CARDIAC) 20 MG/ML IV SOLN
INTRAVENOUS | Status: DC | PRN
  Administered 2017-03-19: 100 mg via INTRAVENOUS

## 2017-03-19 MED ORDER — PROMETHAZINE HCL 25 MG/ML IJ SOLN: 6.2500 mg | INTRAMUSCULAR | Status: DC | PRN

## 2017-03-19 MED ORDER — DEXAMETHASONE SODIUM PHOSPHATE 10 MG/ML IJ SOLN
INTRAMUSCULAR | Status: AC
  Filled 2017-03-19: qty 1

## 2017-03-19 MED ORDER — 0.9 % SODIUM CHLORIDE (POUR BTL) OPTIME
TOPICAL | Status: DC | PRN
  Administered 2017-03-19: 1000 mL

## 2017-03-19 MED ORDER — LACTATED RINGERS IV SOLN: INTRAVENOUS | Status: DC

## 2017-03-19 MED ORDER — ROCURONIUM BROMIDE 100 MG/10ML IV SOLN
INTRAVENOUS | Status: DC | PRN
  Administered 2017-03-19: 50 mg via INTRAVENOUS
  Administered 2017-03-19: 10 mg via INTRAVENOUS

## 2017-03-19 MED ORDER — CELECOXIB 200 MG PO CAPS
ORAL_CAPSULE | ORAL | Status: AC
  Administered 2017-03-19: 400 mg via ORAL
  Filled 2017-03-19: qty 2

## 2017-03-19 MED ORDER — FENTANYL CITRATE (PF) 250 MCG/5ML IJ SOLN
INTRAMUSCULAR | Status: AC
  Filled 2017-03-19: qty 5

## 2017-03-19 MED ORDER — DEXAMETHASONE SODIUM PHOSPHATE 10 MG/ML IJ SOLN
INTRAMUSCULAR | Status: DC | PRN
  Administered 2017-03-19: 10 mg via INTRAVENOUS

## 2017-03-19 MED ORDER — MIDAZOLAM HCL 2 MG/2ML IJ SOLN
INTRAMUSCULAR | Status: AC
  Filled 2017-03-19: qty 2

## 2017-03-19 MED ORDER — SCOPOLAMINE 1 MG/3DAYS TD PT72
MEDICATED_PATCH | TRANSDERMAL | Status: AC
  Filled 2017-03-19: qty 1

## 2017-03-19 MED ORDER — OXYCODONE-ACETAMINOPHEN 5-325 MG PO TABS: 1.0000 | ORAL_TABLET | Freq: Four times a day (QID) | ORAL | 0 refills | Status: DC | PRN

## 2017-03-19 MED ORDER — ACETAMINOPHEN 500 MG PO TABS
1000.0000 mg | ORAL_TABLET | ORAL | Status: AC
  Administered 2017-03-19: 1000 mg via ORAL

## 2017-03-19 MED ORDER — HYDROMORPHONE HCL 1 MG/ML IJ SOLN: 0.2500 mg | INTRAMUSCULAR | Status: DC | PRN

## 2017-03-19 MED ORDER — CELECOXIB 200 MG PO CAPS
400.0000 mg | ORAL_CAPSULE | ORAL | Status: AC
  Administered 2017-03-19: 400 mg via ORAL

## 2017-03-19 MED ORDER — ONDANSETRON HCL 4 MG/2ML IJ SOLN
INTRAMUSCULAR | Status: AC
  Filled 2017-03-19: qty 2

## 2017-03-19 MED ORDER — SUGAMMADEX SODIUM 200 MG/2ML IV SOLN
INTRAVENOUS | Status: AC
  Filled 2017-03-19: qty 2

## 2017-03-19 MED ORDER — CEFAZOLIN SODIUM-DEXTROSE 2-4 GM/100ML-% IV SOLN
2.0000 g | INTRAVENOUS | Status: AC
  Administered 2017-03-19: 2 g via INTRAVENOUS

## 2017-03-19 MED ORDER — KETOROLAC TROMETHAMINE 30 MG/ML IJ SOLN
INTRAMUSCULAR | Status: AC
  Filled 2017-03-19: qty 1

## 2017-03-19 MED ORDER — CHLORHEXIDINE GLUCONATE 4 % EX LIQD: 60.0000 mL | Freq: Once | CUTANEOUS | Status: DC

## 2017-03-19 MED ORDER — GABAPENTIN 300 MG PO CAPS
300.0000 mg | ORAL_CAPSULE | ORAL | Status: AC
  Administered 2017-03-19: 300 mg via ORAL

## 2017-03-19 MED ORDER — LACTATED RINGERS IV SOLN
INTRAVENOUS | Status: DC | PRN
  Administered 2017-03-19 (×2): via INTRAVENOUS

## 2017-03-19 MED ORDER — DOCUSATE SODIUM 100 MG PO CAPS: 100.0000 mg | ORAL_CAPSULE | Freq: Two times a day (BID) | ORAL | 0 refills | Status: AC

## 2017-03-19 MED ORDER — FENTANYL CITRATE (PF) 100 MCG/2ML IJ SOLN
INTRAMUSCULAR | Status: DC | PRN
  Administered 2017-03-19: 50 ug via INTRAVENOUS
  Administered 2017-03-19: 100 ug via INTRAVENOUS
  Administered 2017-03-19 (×2): 50 ug via INTRAVENOUS

## 2017-03-19 MED ORDER — SUGAMMADEX SODIUM 200 MG/2ML IV SOLN
INTRAVENOUS | Status: DC | PRN
  Administered 2017-03-19: 200 mg via INTRAVENOUS

## 2017-03-19 MED ORDER — PROPOFOL 10 MG/ML IV BOLUS
INTRAVENOUS | Status: AC
  Filled 2017-03-19: qty 20

## 2017-03-19 MED ORDER — MEPERIDINE HCL 25 MG/ML IJ SOLN: 6.2500 mg | INTRAMUSCULAR | Status: DC | PRN

## 2017-03-19 MED ORDER — OXYCODONE HCL 5 MG/5ML PO SOLN: 5.0000 mg | Freq: Once | ORAL | Status: DC | PRN

## 2017-03-19 MED ORDER — ACETAMINOPHEN 500 MG PO TABS
ORAL_TABLET | ORAL | Status: AC
  Administered 2017-03-19: 1000 mg via ORAL
  Filled 2017-03-19: qty 2

## 2017-03-19 MED ORDER — PROPOFOL 10 MG/ML IV BOLUS
INTRAVENOUS | Status: DC | PRN
  Administered 2017-03-19: 200 mg via INTRAVENOUS

## 2017-03-19 MED ORDER — ONDANSETRON HCL 4 MG/2ML IJ SOLN
INTRAMUSCULAR | Status: DC | PRN
  Administered 2017-03-19: 4 mg via INTRAVENOUS

## 2017-03-19 SURGICAL SUPPLY — 36 items
APPLIER CLIP 5 13 M/L LIGAMAX5 (MISCELLANEOUS) ×3
BLADE CLIPPER SURG (BLADE) IMPLANT
CANISTER SUCT 3000ML PPV (MISCELLANEOUS) ×3 IMPLANT
CHLORAPREP W/TINT 26ML (MISCELLANEOUS) ×3 IMPLANT
CLIP APPLIE 5 13 M/L LIGAMAX5 (MISCELLANEOUS) ×1 IMPLANT
COVER SURGICAL LIGHT HANDLE (MISCELLANEOUS) ×3 IMPLANT
DERMABOND ADVANCED (GAUZE/BANDAGES/DRESSINGS) ×2
DERMABOND ADVANCED .7 DNX12 (GAUZE/BANDAGES/DRESSINGS) ×1 IMPLANT
ELECT REM PT RETURN 9FT ADLT (ELECTROSURGICAL) ×3
ELECTRODE REM PT RTRN 9FT ADLT (ELECTROSURGICAL) ×1 IMPLANT
GLOVE BIO SURGEON STRL SZ 6 (GLOVE) ×3 IMPLANT
GLOVE BIOGEL PI IND STRL 6.5 (GLOVE) ×1 IMPLANT
GLOVE BIOGEL PI IND STRL 7.0 (GLOVE) ×1 IMPLANT
GLOVE BIOGEL PI INDICATOR 6.5 (GLOVE) ×2
GLOVE BIOGEL PI INDICATOR 7.0 (GLOVE) ×2
GLOVE SURG SS PI 6.5 STRL IVOR (GLOVE) ×3 IMPLANT
GOWN STRL REUS W/ TWL LRG LVL3 (GOWN DISPOSABLE) ×3 IMPLANT
GOWN STRL REUS W/TWL LRG LVL3 (GOWN DISPOSABLE) ×6
GRASPER SUT TROCAR 14GX15 (MISCELLANEOUS) ×3 IMPLANT
KIT BASIN OR (CUSTOM PROCEDURE TRAY) ×3 IMPLANT
KIT ROOM TURNOVER OR (KITS) ×3 IMPLANT
NEEDLE INSUFFLATION 14GA 120MM (NEEDLE) ×3 IMPLANT
NS IRRIG 1000ML POUR BTL (IV SOLUTION) ×3 IMPLANT
PAD ARMBOARD 7.5X6 YLW CONV (MISCELLANEOUS) ×6 IMPLANT
POUCH SPECIMEN RETRIEVAL 10MM (ENDOMECHANICALS) ×3 IMPLANT
SCISSORS LAP 5X35 DISP (ENDOMECHANICALS) ×3 IMPLANT
SET IRRIG TUBING LAPAROSCOPIC (IRRIGATION / IRRIGATOR) ×3 IMPLANT
SLEEVE ENDOPATH XCEL 5M (ENDOMECHANICALS) ×6 IMPLANT
SPECIMEN JAR SMALL (MISCELLANEOUS) IMPLANT
SUT MNCRL AB 4-0 PS2 18 (SUTURE) ×3 IMPLANT
TOWEL OR 17X24 6PK STRL BLUE (TOWEL DISPOSABLE) ×3 IMPLANT
TOWEL OR 17X26 10 PK STRL BLUE (TOWEL DISPOSABLE) IMPLANT
TRAY LAPAROSCOPIC MC (CUSTOM PROCEDURE TRAY) ×3 IMPLANT
TROCAR XCEL NON-BLD 11X100MML (ENDOMECHANICALS) ×3 IMPLANT
TROCAR XCEL NON-BLD 5MMX100MML (ENDOMECHANICALS) ×3 IMPLANT
TUBING INSUFFLATION (TUBING) ×3 IMPLANT

## 2017-03-19 NOTE — Discharge Instructions (Signed)
[facility] ______[facility] °LAPAROSCOPIC SURGERY: POST OP INSTRUCTIONS °Always review your discharge instruction sheet given to you by the facility where your surgery was performed. °IF YOU HAVE DISABILITY OR FAMILY LEAVE FORMS, YOU MUST BRING THEM TO THE OFFICE FOR PROCESSING.   °DO NOT GIVE THEM TO YOUR DOCTOR. ° °1. A prescription for pain medication may be given to you upon discharge.  Take your pain medication as prescribed, if needed.  If narcotic pain medicine is not needed, then you may take acetaminophen (Tylenol) or ibuprofen (Advil) as needed. °2. Take your usually prescribed medications unless otherwise directed. °3. If you need a refill on your pain medication, please contact your pharmacy.  They will contact our office to request authorization. Prescriptions will not be filled after 5pm or on week-ends. °4. You should follow a light diet the first few days after arrival home, such as soup and crackers, etc.  Be sure to include lots of fluids daily. °5. Most patients will experience some swelling and bruising in the area of the incisions.  Ice packs will help.  Swelling and bruising can take several days to resolve.  °6. It is common to experience some constipation if taking pain medication after surgery.  Increasing fluid intake and taking a stool softener (such as Colace) will usually help or prevent this problem from occurring.  A mild laxative (Milk of Magnesia or Miralax) should be taken according to package instructions if there are no bowel movements after 48 hours. °7. Unless discharge instructions indicate otherwise, you may remove your bandages 24-48 hours after surgery, and you may shower at that time.  You may have steri-strips (small skin tapes) in place directly over the incision.  These strips should be left on the skin for 7-10 days.  If your surgeon used skin glue on the incision, you may shower in 24 hours.  The glue will flake off over the next 2-3 weeks.  Any sutures or  staples will be removed at the office during your follow-up visit. °8. ACTIVITIES:  You may resume regular (light) daily activities beginning the next day--such as daily self-care, walking, climbing stairs--gradually increasing activities as tolerated.  You may have sexual intercourse when it is comfortable.  Refrain from any heavy lifting or straining until approved by your doctor. °a. You may drive when you are no longer taking prescription pain medication, you can comfortably wear a seatbelt, and you can safely maneuver your car and apply brakes. °b. RETURN TO WORK:  ____1-2 weeks__________________________________________________ °9. You should see your doctor in the office for a follow-up appointment approximately 2-3 weeks after your surgery.  Make sure that you call for this appointment within a day or two after you arrive home to insure a convenient appointment time. °10. OTHER INSTRUCTIONS: __________________________________________________________________________________________________________________________ __________________________________________________________________________________________________________________________ °WHEN TO CALL YOUR DOCTOR: °1. Fever over 101.0 °2. Inability to urinate °3. Continued bleeding from incision. °4. Increased pain, redness, or drainage from the incision. °5. Increasing abdominal pain ° °The clinic staff is available to answer your questions during regular business hours.  Please don’t hesitate to call and ask to speak to one of the nurses for clinical concerns.  If you have a medical emergency, go to the nearest emergency room or call 911.  A surgeon from [facility] is always on call at the hospital. °[address], [address], [place], [place]  [address] ? [address] [address], [place], [place]   [address] °[phone] ? [phone] ? FAX [phone] °Web   site: [email or web address] ° °

## 2017-03-19 NOTE — Interval H&P Note (Signed)
History and Physical Interval Note:  03/19/2017 9:19 AM  Carolyn Gilmore  has presented today for surgery, with the diagnosis of BILIARY COLIC   The various methods of treatment have been discussed with the patient and family. After consideration of risks, benefits and other options for treatment, the patient has consented to  Procedure(s): LAPAROSCOPIC CHOLECYSTECTOMY (N/A) as a surgical intervention .  The patient's history has been reviewed, patient examined, no change in status, stable for surgery.  I have reviewed the patient's chart and labs.  Questions were answered to the patient's satisfaction.     Roshawn Ayala Rich Brave

## 2017-03-19 NOTE — Op Note (Signed)
Operative Note  Carolyn Gilmore 36 y.o. female [id number]  03/19/2017  Surgeon: Clovis Riley MD  Assistant: OR staff  Procedure performed: Laparoscopic Cholecystectomy  Preop diagnosis: biliary colic Post-op diagnosis/intraop findings: same  Specimens: gallbladder  EBL: 82XH  Complications: none  Description of procedure: After obtaining informed consent the patient was brought to the operating room. Prophylactic antibiotics and subcutaneous heparin were administered. SCD's were applied. General endotracheal anesthesia was initiated and a formal time-out was performed. The abdomen was prepped and draped in the usual sterile fashion and the abdomen was entered using an infraumbilical veress needle after instilling the site with local. Insufflation to 56mmHg was obtained, 54mm trocar and camera inserted and gross inspection revealed no evidence of injury from our entry or other intraabdominal abnormalities. Two 40mm trocars were introduced in the right midclavicular and right anterior axillary lines under direct visualization and following infiltration with local. An 67mm trocar was placed in the epigastrium. The gallbladder was retracted cephalad and the infundibulum was retracted laterally. A combination of hook electrocautery and blunt dissection was utilized to clear the peritoneum from the neck and cystic duct, circumferentially isolating the cystic artery and cystic duct and lifting the gallbladder from the cystic plate. The critical view of safety was achieved with the cystic artery, cystic duct, and liver bed visualized between them with no other structures. The artery was clipped with a single clip proximally and distally and divided as was the cystic duct with two clips on the proximal end. The gallbladder was dissected from the liver plate using electrocautery. Once freed the gallbladder was placed in an endocatch bag and removed through the epigastric trocar site. A small amount of  bleeding on the liver bed was controlled with cautery. Some bile but no stones had been spilled from the gallbladder during its dissection from the liver bed. This was aspirated and the right upper quadrant was irrigated copiously until the effluent was clear. Hemostasis was once again confirmed, and reinspection of the abdomen revealed no injuries. The clips were well opposed without any bile leak from the duct or the liver bed. The 72mm trocar site in the epigastrium was closed with two interrupted 0 vicryls in the fascia under direct visualization using a PMI device. This incision was then steeped with betadine for a few minutes. The abdomen was desufflated and all trocars removed. The skin incisions were closed with running subcuticular monocryl and Dermabond. The patient was awakened, extubated and transported to the recovery room in stable condition.   All counts were correct at the completion of the case.

## 2017-03-19 NOTE — Anesthesia Postprocedure Evaluation (Signed)
Anesthesia Post Note  Patient: Carolyn Gilmore  Procedure(s) Performed: Procedure(s) (LRB): LAPAROSCOPIC CHOLECYSTECTOMY (N/A)     Patient location during evaluation: PACU Anesthesia Type: General Level of consciousness: sedated and patient cooperative Pain management: pain level controlled Vital Signs Assessment: post-procedure vital signs reviewed and stable Respiratory status: spontaneous breathing Cardiovascular status: stable Anesthetic complications: no    Last Vitals:  Vitals:   03/19/17 1203 03/19/17 1207  BP: 130/89 132/84  Pulse: (!) 112 (!) 109  Resp: 18 (!) 22  Temp:  36.7 C  SpO2: 97% 95%    Last Pain:  Vitals:   03/19/17 1207  TempSrc:   PainSc: 2                  Nolon Nations

## 2017-03-19 NOTE — Anesthesia Procedure Notes (Signed)
Procedure Name: Intubation Date/Time: 03/19/2017 10:15 AM Performed by: Luciana Axe K Pre-anesthesia Checklist: Patient identified, Emergency Drugs available, Suction available and Patient being monitored Patient Re-evaluated:Patient Re-evaluated prior to induction Oxygen Delivery Method: Circle System Utilized Preoxygenation: Pre-oxygenation with 100% oxygen Induction Type: IV induction Ventilation: Mask ventilation without difficulty Laryngoscope Size: Miller and 3 Grade View: Grade I Tube type: Oral Tube size: 7.0 mm Number of attempts: 1 Airway Equipment and Method: Stylet and Oral airway Placement Confirmation: ETT inserted through vocal cords under direct vision,  positive ETCO2 and breath sounds checked- equal and bilateral Secured at: 21 cm Tube secured with: Tape Dental Injury: Teeth and Oropharynx as per pre-operative assessment

## 2017-03-19 NOTE — H&P (View-Only) (Signed)
Carolyn Gilmore 03/03/2017 1:34 PM Location: [facility] Patient #: [id number] DOB: 03-22-1981 Married / Language: English / Race: Asian Female  History of Present Illness (Carolyn Gilmore A. Kae Heller MD; 03/03/2017 1:48 PM) Patient words: This is a very pleasant and relatively healthy 35 year old woman who works as an Warden/ranger at [employer] who presents with biliary colic. She initially was found to have elevated LFTs in March of this year on routine labs with her primary care provider. Alkaline phosphatase is 225, AST 53, ALT 147, bilirubin 0.4. CBC was normal and her lateral x-ray otherwise normal. This provided an ultrasound which was performed in March of this year which showed "either one large or multiple confluent echogenic foci which move and shadow consistent with cholelithiasis", no cholecystitis, common bile duct was 2 mm, no focal liver lesions were present and no mention of steatosis. As she was asymptomatic at that time she was advised to continue monitoring, however about a month ago she began to have abdominal pain which she described as diffuse and radiating around to the right back, associated with nausea but no emesis. It is occasionally triggered by eating. This happens once or twice a week. She then was referred here for further evaluation. She is also interested in learning about bariatric surgery.  The patient is a 36 year old female.   Past Surgical History (Carolyn Gilmore, [place]; 03/03/2017 1:34 PM) Colon Polyp Removal - Colonoscopy  Diagnostic Studies History (Carolyn Gilmore, [place]; 03/03/2017 1:34 PM) Colonoscopy >10 years ago Mammogram never Pap Smear 1-5 years ago  Allergies (Carolyn Gilmore, [facility]; 03/03/2017 1:36 PM) No Known Drug Allergies 03/03/2017 Allergies Reconciled  Medication History (Carolyn Gilmore, [place]; 03/03/2017 1:36 PM) ClonazePAM (0.5MG  Tablet, Oral) Active. DULoxetine HCl (60MG  Capsule DR Part, Oral) Active. Medications Reconciled  Social  History (Carolyn Gilmore, [place]; 03/03/2017 1:34 PM) Alcohol use Moderate alcohol use. Caffeine use Carbonated beverages. No drug use Tobacco use Never smoker.  Family History (Carolyn Gilmore, [place]; 03/03/2017 1:34 PM) Alcohol Abuse Brother. Colon Polyps Father. Depression Brother. Diabetes Mellitus Father, Mother. Hypertension Father, Mother. Ischemic Bowel Disease Brother. Prostate Cancer Father. Respiratory Condition Mother.  Pregnancy / Birth History (Carolyn Gilmore, [place]; 03/03/2017 1:34 PM) Age at menarche 43 years. Contraceptive History Intrauterine device. Gravida 1 Irregular periods Length (months) of breastfeeding >24 Maternal age 3-35 Para 1  Other Problems (Carolyn Gilmore, [place]; 03/03/2017 1:34 PM) Anxiety Disorder Cholelithiasis Depression Melanoma     Review of Systems (Carolyn Gilmore RMA; 03/03/2017 1:34 PM) General Not Present- Appetite Loss, Chills, Fatigue, Fever, Night Sweats, Weight Gain and Weight Loss. HEENT Present- Seasonal Allergies, Sinus Pain, Sore Throat and Wears glasses/contact lenses. Not Present- Earache, Hearing Loss, Hoarseness, Nose Bleed, Oral Ulcers, Ringing in the Ears, Visual Disturbances and Yellow Eyes. Respiratory Present- Chronic Cough and Snoring. Not Present- Bloody sputum, Difficulty Breathing and Wheezing. Breast Not Present- Breast Mass, Breast Pain, Nipple Discharge and Skin Changes. Cardiovascular Present- Palpitations. Not Present- Chest Pain, Difficulty Breathing Lying Down, Leg Cramps, Rapid Heart Rate, Shortness of Breath and Swelling of Extremities. Gastrointestinal Present- Abdominal Pain, Bloating, Change in Bowel Habits, Excessive gas, Indigestion, Nausea and Vomiting. Not Present- Bloody Stool, Chronic diarrhea, Constipation, Difficulty Swallowing, Gets full quickly at meals, Hemorrhoids and Rectal Pain. Female Genitourinary Present- Urgency. Not Present- Frequency, Nocturia, Painful Urination and  Pelvic Pain. Neurological Not Present- Decreased Memory, Fainting, Headaches, Numbness, Seizures, Tingling, Tremor, Trouble walking and Weakness. Psychiatric Present- Anxiety and Depression. Not Present- Bipolar, Change  in Sleep Pattern, Fearful and Frequent crying. Hematology Present- Easy Bruising and Persistent Infections. Not Present- Blood Thinners, Excessive bleeding, Gland problems and HIV.  Vitals (Carolyn Gilmore RMA; 03/03/2017 1:36 PM) 03/03/2017 1:35 PM Weight: 241 lb Height: 69in Body Surface Area: 2.24 m Body Mass Index: 35.59 kg/m  Temp.: 97.42F  Pulse: 103 (Regular)  P.OX: 98% (Room air) BP: 122/74 (Sitting, Left Arm, Standard)      Physical Exam (Maylon Sailors A. Kae Heller MD; 03/03/2017 1:50 PM)  General Note: She is alert and well-appearing  Integumentary Note: Skin is warm and dry  Eye Note: No scleral icterus. Extraocular movements intact.  Chest and Lung Exam Note: Labored respirations. Symmetrical air entry.  Cardiovascular Note: Regular rate and rhythm. No pedal edema, palpable pedal pulses bilaterally.  Abdomen Note: Soft, obese, nontender. No surgical scars, no palpable mass organomegaly or hernia.  Neurologic Note: Grossly intact. Normal gait  Neuropsychiatric Note: Normal mood and affect, appropriate insight  Musculoskeletal Note: Straight symmetrical throughout. No deformity    Assessment & Plan (Salvator Seppala A. Kae Heller MD; 03/03/2017 8:56 PM)  BILIARY COLIC (D14.97) Story: Her laparoscopic cholecystectomy. We discussed risks of bleeding, infection, pain, scarring, and abdominal injury specifically to the common bile duct and sequelae, conversion to open surgery, and failure to resolve symptoms. We also discussed the risks of general anesthesia including blood clots, heart attack, stroke, pneumonia. She is expressed understanding and desires to proceed we will get her scheduled in the coming weeks.

## 2017-03-19 NOTE — Transfer of Care (Signed)
Immediate Anesthesia Transfer of Care Note  Patient: Carolyn Gilmore  Procedure(s) Performed: Procedure(s): LAPAROSCOPIC CHOLECYSTECTOMY (N/A)  Patient Location: PACU  Anesthesia Type:General  Level of Consciousness: awake, oriented and patient cooperative  Airway & Oxygen Therapy: Patient Spontanous Breathing and Patient connected to nasal cannula oxygen  Post-op Assessment: Report given to RN and Post -op Vital signs reviewed and stable  Post vital signs: Reviewed  Last Vitals:  Vitals:   03/19/17 0808  BP: (!) 152/94  Resp: 18  Temp: 36.6 C  SpO2: 99%    Last Pain:  Vitals:   03/19/17 0808  TempSrc: Oral      Patients Stated Pain Goal: 3 (35/00/93 8182)  Complications: No apparent anesthesia complications

## 2017-03-19 NOTE — Anesthesia Preprocedure Evaluation (Addendum)
Anesthesia Evaluation  Patient identified by MRN, date of birth, ID band Patient awake    Reviewed: Allergy & Precautions, NPO status , Patient's Chart, lab work & pertinent test results  Airway Mallampati: II  TM Distance: >3 FB Neck ROM: Full    Dental no notable dental hx. (+) Teeth Intact, Dental Advisory Given,    Pulmonary neg pulmonary ROS,    Pulmonary exam normal breath sounds clear to auscultation       Cardiovascular hypertension, Pt. on medications Normal cardiovascular exam Rhythm:Regular Rate:Normal     Neuro/Psych PSYCHIATRIC DISORDERS Anxiety Depression negative neurological ROS     GI/Hepatic negative GI ROS, Neg liver ROS,   Endo/Other  diabetes, Type 2  Renal/GU negative Renal ROS     Musculoskeletal negative musculoskeletal ROS (+)   Abdominal   Peds  Hematology negative hematology ROS (+)   Anesthesia Other Findings Top front teeth veneers  Reproductive/Obstetrics negative OB ROS                           Anesthesia Physical Anesthesia Plan  ASA: II  Anesthesia Plan: General   Post-op Pain Management:    Induction: Intravenous  PONV Risk Score and Plan: 4 or greater and Ondansetron, Dexamethasone, Midazolam and Scopolamine patch - Pre-op  Airway Management Planned: Oral ETT  Additional Equipment:   Intra-op Plan:   Post-operative Plan: Extubation in OR  Informed Consent: I have reviewed the patients History and Physical, chart, labs and discussed the procedure including the risks, benefits and alternatives for the proposed anesthesia with the patient or authorized representative who has indicated his/her understanding and acceptance.   Dental advisory given  Plan Discussed with: CRNA  Anesthesia Plan Comments:        Anesthesia Quick Evaluation

## 2017-03-20 ENCOUNTER — Encounter (HOSPITAL_COMMUNITY): Payer: Self-pay | Admitting: Surgery

## 2017-03-30 ENCOUNTER — Ambulatory Visit (INDEPENDENT_AMBULATORY_CARE_PROVIDER_SITE_OTHER): Payer: BLUE CROSS/BLUE SHIELD | Admitting: Psychology

## 2017-03-30 DIAGNOSIS — F330 Major depressive disorder, recurrent, mild: Secondary | ICD-10-CM

## 2017-04-14 ENCOUNTER — Ambulatory Visit (INDEPENDENT_AMBULATORY_CARE_PROVIDER_SITE_OTHER): Payer: BLUE CROSS/BLUE SHIELD | Admitting: Psychology

## 2017-04-14 ENCOUNTER — Telehealth: Payer: Self-pay | Admitting: Physician Assistant

## 2017-04-14 DIAGNOSIS — F411 Generalized anxiety disorder: Secondary | ICD-10-CM | POA: Diagnosis not present

## 2017-04-14 NOTE — Telephone Encounter (Signed)
Patient came in to see Lattie Haw and asked at check in about a referral she was supposed to have for a "genetic counselor" for her and her husband for a blood test. Please call patient and advise. OK to leave message.

## 2017-04-14 NOTE — Telephone Encounter (Signed)
Called and spoke with patient. Advised to call [facility] at [phone]. Encouraged patient to let us know if she has any issues with them.

## 2017-04-14 NOTE — Telephone Encounter (Signed)
Carolyn Gilmore, please follow up with this. Aldona Bar said she talked to you about her and her husband Catalina Antigua for Genetic referral.

## 2017-05-25 IMAGING — US US ABDOMEN LIMITED
1 series · 14 of 25 positions shown · non-contrast
Comparison: None.

CLINICAL DATA: Elevated liver enzymes

EXAM:
US ABDOMEN LIMITED - RIGHT UPPER QUADRANT

[Series 1: us abdomen limited · 0.22mm/px · 14 of 37 slices shown]
[im 1/37]
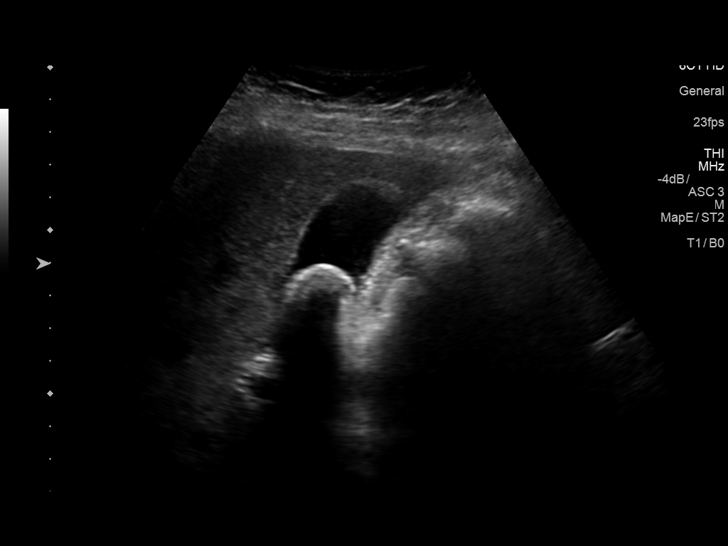
[im 4/37]
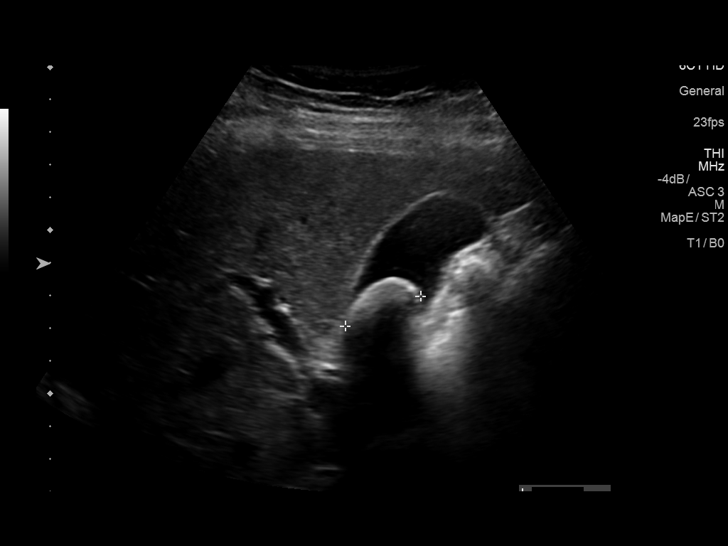
[im 7/37]
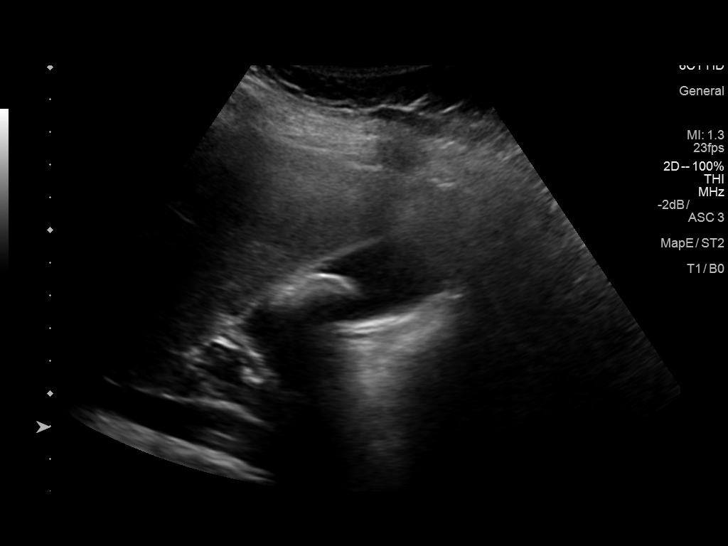
[im 10/37]
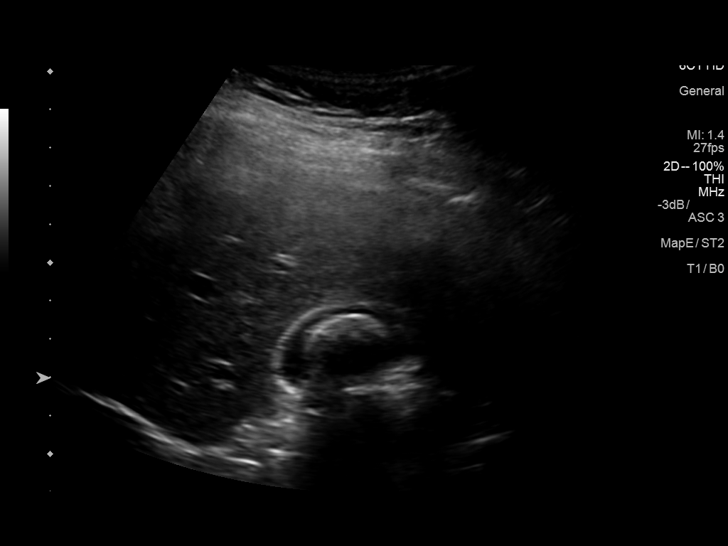
[im 13/37]
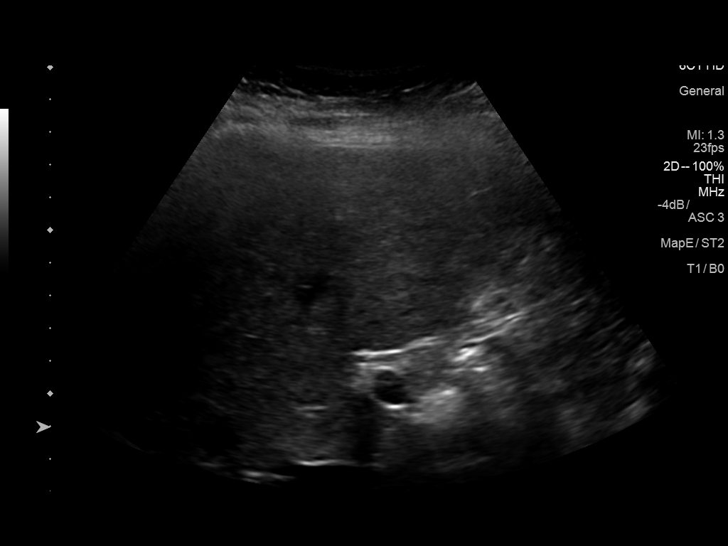
[im 14/37]
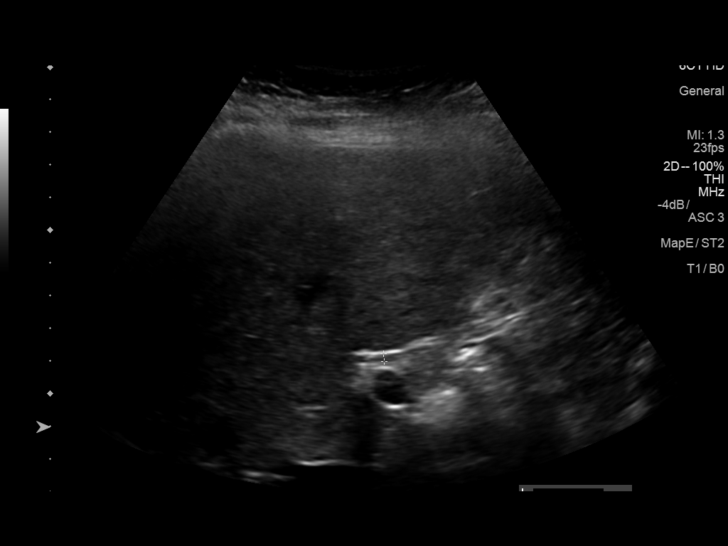
[im 17/37]
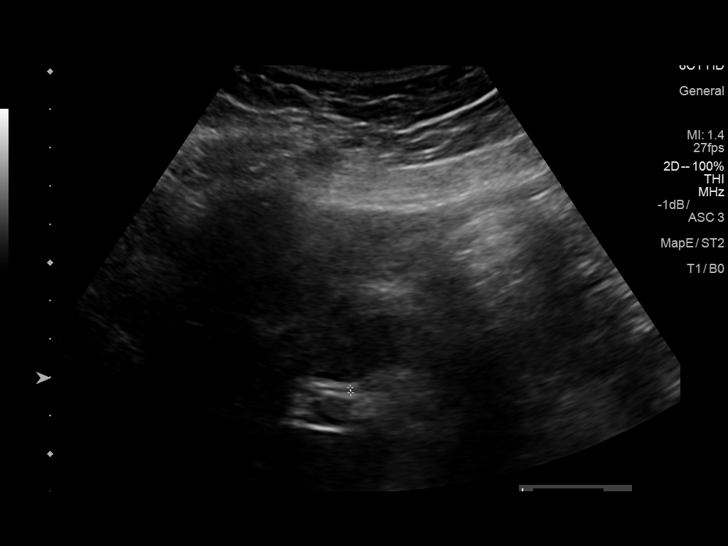
[im 20/37]
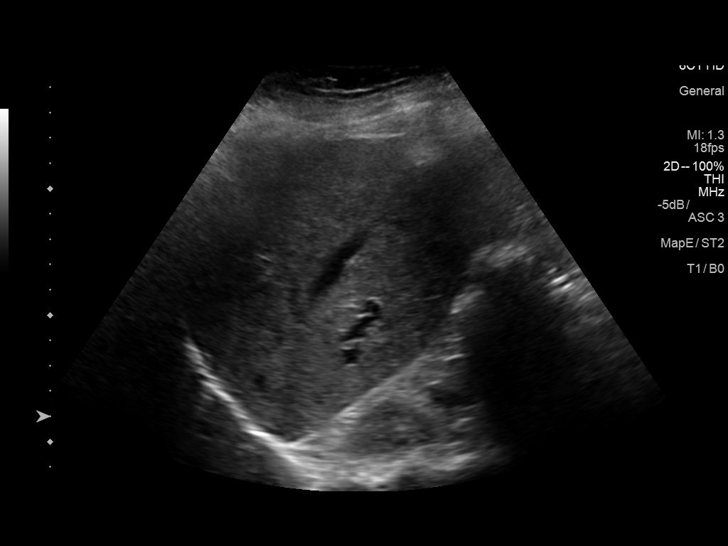
[im 23/37]
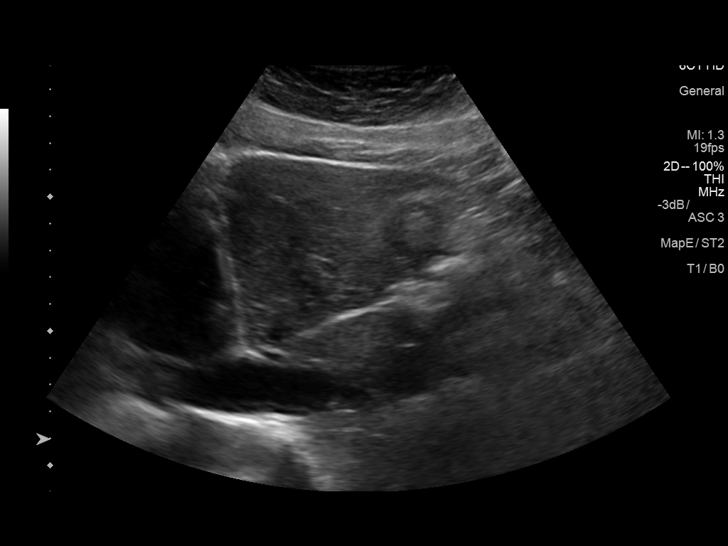
[im 25/37]
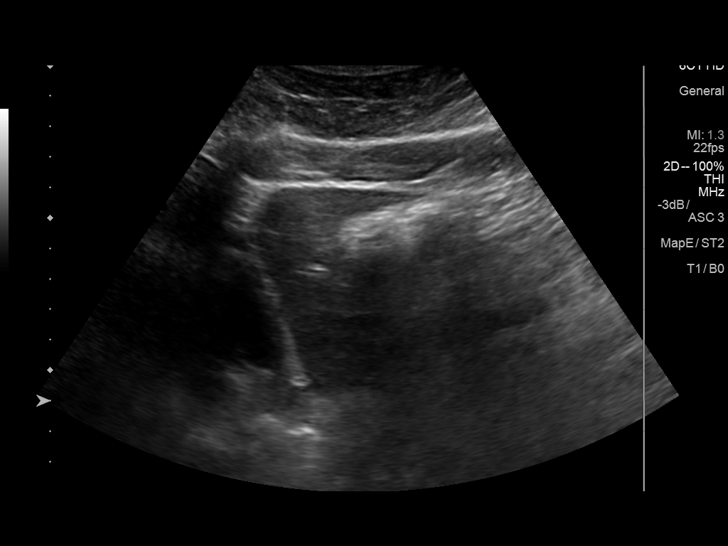
[im 28/37]
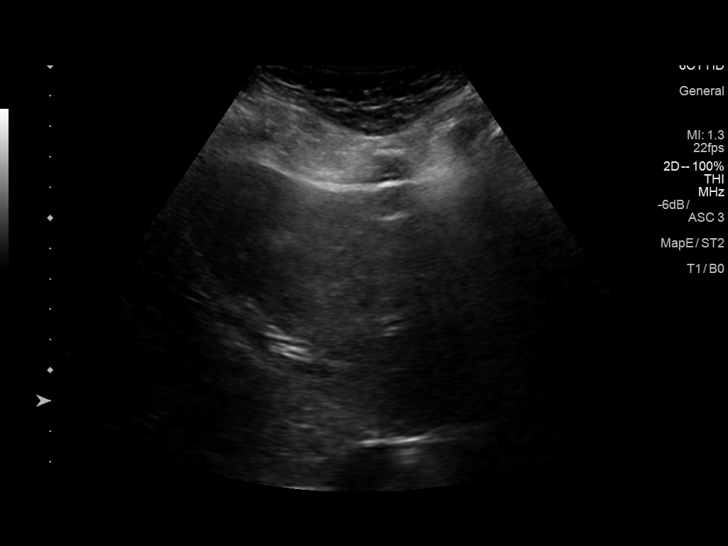
[im 31/37]
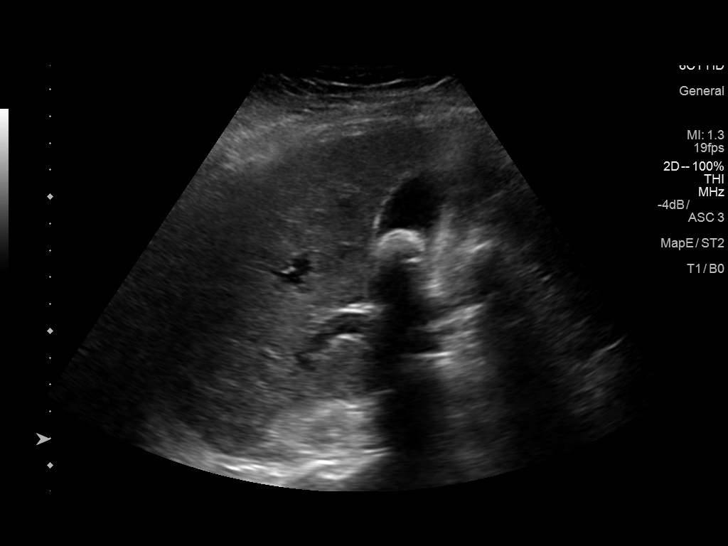
[im 34/37]
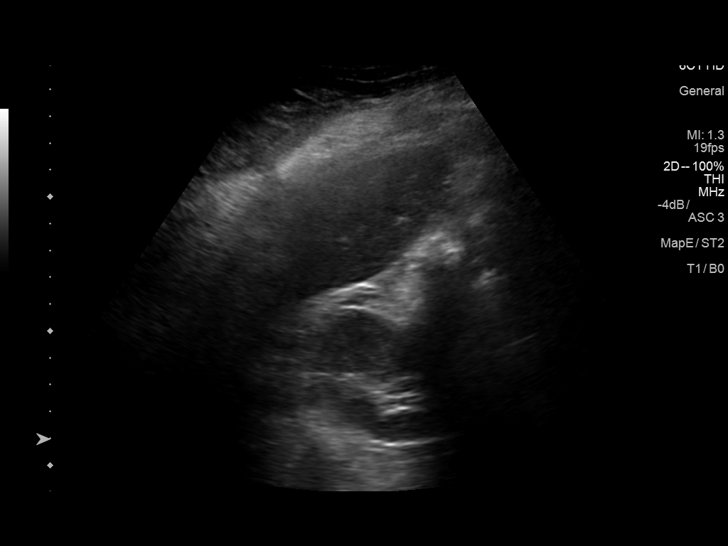
[im 37/37]
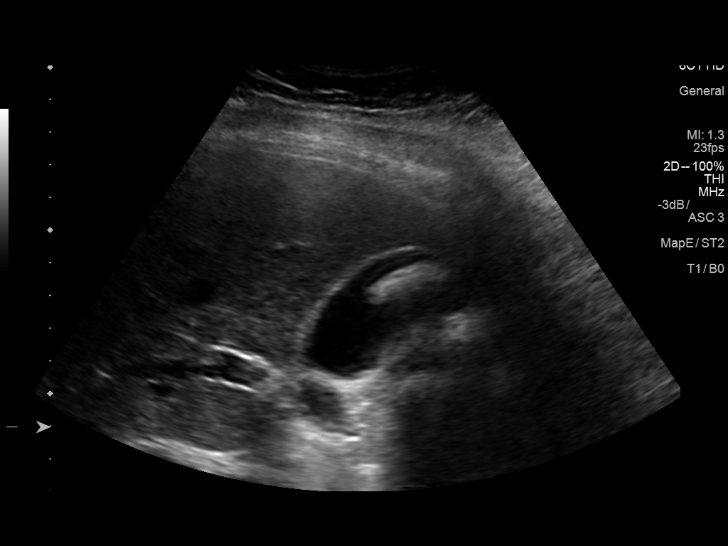

[14 of 25 positions shown; findings below may reference images not displayed]

FINDINGS: Gallbladder:

Within the gallbladder, there is either one large or multiple
confluent echogenic foci which move and shadow consistent with
cholelithiasis. This echogenic focus measures 2.7 cm in length. No
gallbladder wall thickening or pericholecystic fluid. No sonographic
Murphy sign noted by sonographer.

Common bile duct:

Diameter: 2 mm. No intrahepatic or extrahepatic biliary duct
dilatation.

Liver:

No focal liver lesions are evident.
IMPRESSION: Cholelithiasis.  Study otherwise unremarkable.

## 2017-05-26 ENCOUNTER — Ambulatory Visit (INDEPENDENT_AMBULATORY_CARE_PROVIDER_SITE_OTHER): Payer: BLUE CROSS/BLUE SHIELD | Admitting: Psychology

## 2017-05-26 DIAGNOSIS — F411 Generalized anxiety disorder: Secondary | ICD-10-CM

## 2017-06-01 ENCOUNTER — Encounter: Payer: Self-pay | Admitting: Genetics

## 2017-06-07 ENCOUNTER — Encounter: Payer: Self-pay | Admitting: Genetics

## 2017-06-15 DIAGNOSIS — F419 Anxiety disorder, unspecified: Secondary | ICD-10-CM | POA: Diagnosis not present

## 2017-07-12 ENCOUNTER — Ambulatory Visit (INDEPENDENT_AMBULATORY_CARE_PROVIDER_SITE_OTHER): Payer: BLUE CROSS/BLUE SHIELD | Admitting: Psychology

## 2017-07-12 DIAGNOSIS — F411 Generalized anxiety disorder: Secondary | ICD-10-CM | POA: Diagnosis not present

## 2017-07-22 ENCOUNTER — Telehealth: Payer: BLUE CROSS/BLUE SHIELD | Admitting: Family

## 2017-07-22 DIAGNOSIS — J028 Acute pharyngitis due to other specified organisms: Secondary | ICD-10-CM

## 2017-07-22 DIAGNOSIS — B9689 Other specified bacterial agents as the cause of diseases classified elsewhere: Secondary | ICD-10-CM

## 2017-07-22 MED ORDER — PREDNISONE 5 MG PO TABS: 5.0000 mg | ORAL_TABLET | ORAL | 0 refills | Status: DC

## 2017-07-22 MED ORDER — BENZONATATE 100 MG PO CAPS: 100.0000 mg | ORAL_CAPSULE | Freq: Three times a day (TID) | ORAL | 0 refills | Status: DC | PRN

## 2017-07-22 MED ORDER — AZITHROMYCIN 250 MG PO TABS: ORAL_TABLET | ORAL | 0 refills | Status: DC

## 2017-07-22 NOTE — Progress Notes (Signed)
Thank you for the details you included in the comment boxes. Those details are very helpful in determining the best course of treatment for you and help [place] to provide the best care. The template below will say cough but has been modified specifically for your sinus and cough together.   We are sorry that you are not feeling well.  Here is how we plan to help!  Based on your presentation I believe you most likely have A cough due to bacteria.  When patients have a fever and a productive cough with a change in color or increased sputum production, we are concerned about bacterial bronchitis.  If left untreated it can progress to pneumonia.  If your symptoms do not improve with your treatment plan it is important that you contact your provider.   I have prescribed Azithromyin 250 mg: two tablets now and then one tablet daily for 4 additonal days    In addition you may use A non-prescription cough medication called Mucinex DM: take 2 tablets every 12 hours. and A prescription cough medication called Tessalon Perles 100mg . You may take 1-2 capsules every 8 hours as needed for your cough.  Sterapred 5 mg dosepak  From your responses in the eVisit questionnaire you describe inflammation in the upper respiratory tract which is causing a significant cough.  This is commonly called Bronchitis and has four common causes:    Allergies  Viral Infections  Acid Reflux  Bacterial Infection Allergies, viruses and acid reflux are treated by controlling symptoms or eliminating the cause. An example might be a cough caused by taking certain blood pressure medications. You stop the cough by changing the medication. Another example might be a cough caused by acid reflux. Controlling the reflux helps control the cough.  USE OF BRONCHODILATOR ("RESCUE") INHALERS: There is a risk from using your bronchodilator too frequently.  The risk is that over-reliance on a medication which only relaxes the muscles surrounding the  breathing tubes can reduce the effectiveness of medications prescribed to reduce swelling and congestion of the tubes themselves.  Although you feel brief relief from the bronchodilator inhaler, your asthma may actually be worsening with the tubes becoming more swollen and filled with mucus.  This can delay other crucial treatments, such as oral steroid medications. If you need to use a bronchodilator inhaler daily, several times per day, you should discuss this with your provider.  There are probably better treatments that could be used to keep your asthma under control.     HOME CARE . Only take medications as instructed by your medical team. . Complete the entire course of an antibiotic. . Drink plenty of fluids and get plenty of rest. . Avoid close contacts especially the very young and the elderly . Cover your mouth if you cough or cough into your sleeve. . Always remember to wash your hands . A steam or ultrasonic humidifier can help congestion.   GET HELP RIGHT AWAY IF: . You develop worsening fever. . You become short of breath . You cough up blood. . Your symptoms persist after you have completed your treatment plan MAKE SURE YOU   Understand these instructions.  Will watch your condition.  Will get help right away if you are not doing well or get worse.  Your e-visit answers were reviewed by a board certified advanced clinical practitioner to complete your personal care plan.  Depending on the condition, your plan could have included both over the counter or prescription medications.  If there is a problem please reply  once you have received a response from your provider. Your safety is important to [place].  If you have drug allergies check your prescription carefully.    You can use MyChart to ask questions about today's visit, request a non-urgent call back, or ask for a work or school excuse for 24 hours related to this e-Visit. If it has been greater than 24 hours you will need  to follow up with your provider, or enter a new e-Visit to address those concerns. You will get an e-mail in the next two days asking about your experience.  I hope that your e-visit has been valuable and will speed your recovery. Thank you for using e-visits.

## 2017-07-29 DIAGNOSIS — Z6836 Body mass index (BMI) 36.0-36.9, adult: Secondary | ICD-10-CM | POA: Diagnosis not present

## 2017-07-29 DIAGNOSIS — Z01419 Encounter for gynecological examination (general) (routine) without abnormal findings: Secondary | ICD-10-CM | POA: Diagnosis not present

## 2017-07-30 ENCOUNTER — Other Ambulatory Visit: Payer: Self-pay | Admitting: Obstetrics and Gynecology

## 2017-07-30 DIAGNOSIS — N6320 Unspecified lump in the left breast, unspecified quadrant: Secondary | ICD-10-CM

## 2017-08-04 ENCOUNTER — Other Ambulatory Visit: Payer: Self-pay

## 2017-08-06 ENCOUNTER — Ambulatory Visit
Admission: RE | Admit: 2017-08-06 | Discharge: 2017-08-06 | Disposition: A | Discharge status: Home / Self Care | Payer: BLUE CROSS/BLUE SHIELD | Source: Ambulatory Visit | Attending: Obstetrics and Gynecology | Admitting: Obstetrics and Gynecology

## 2017-08-06 ENCOUNTER — Other Ambulatory Visit: Payer: Self-pay | Admitting: Obstetrics and Gynecology

## 2017-08-06 DIAGNOSIS — R928 Other abnormal and inconclusive findings on diagnostic imaging of breast: Secondary | ICD-10-CM | POA: Diagnosis not present

## 2017-08-06 DIAGNOSIS — N6310 Unspecified lump in the right breast, unspecified quadrant: Secondary | ICD-10-CM

## 2017-08-06 DIAGNOSIS — N6320 Unspecified lump in the left breast, unspecified quadrant: Secondary | ICD-10-CM

## 2017-08-06 DIAGNOSIS — N6489 Other specified disorders of breast: Secondary | ICD-10-CM | POA: Diagnosis not present

## 2017-08-12 ENCOUNTER — Telehealth: Payer: BLUE CROSS/BLUE SHIELD | Admitting: Family

## 2017-08-12 DIAGNOSIS — J111 Influenza due to unidentified influenza virus with other respiratory manifestations: Secondary | ICD-10-CM

## 2017-08-12 MED ORDER — OSELTAMIVIR PHOSPHATE 75 MG PO CAPS: 75.0000 mg | ORAL_CAPSULE | Freq: Two times a day (BID) | ORAL | 0 refills | Status: DC

## 2017-08-12 NOTE — Progress Notes (Signed)
Thank you for the details you included in the comment boxes. Those details are very helpful in determining the best course of treatment for you and help us to provide the best care.  E visit for Flu like symptoms   We are sorry that you are not feeling well.  Here is how we plan to help! Based on what you have shared with me it looks like you may have possible exposure to a virus that causes influenza.  Influenza or "the flu" is   an infection caused by a respiratory virus. The flu virus is highly contagious and persons who did not receive their yearly flu vaccination may "catch" the flu from close contact.  We have anti-viral medications to treat the viruses that cause this infection. They are not a "cure" and only shorten the course of the infection. These prescriptions are most effective when they are given within the first 2 days of "flu" symptoms. Antiviral medication are indicated if you have a high risk of complications from the flu. You should  also consider an antiviral medication if you are in close contact with someone who is at risk. These medications can help patients avoid complications from the flu  but have side effects that you should know. Possible side effects from Tamiflu or oseltamivir include nausea, vomiting, diarrhea, dizziness, headaches, eye redness, sleep problems or other respiratory symptoms. You should not take Tamiflu if you have an allergy to oseltamivir or any to the ingredients in Tamiflu.  Based upon your symptoms and potential risk factors I have prescribed Oseltamivir (Tamiflu).  It has been sent to your designated pharmacy.  You will take one 75 mg capsule orally twice a day for the next 5 days.  ANYONE WHO HAS FLU SYMPTOMS SHOULD: . Stay home. The flu is highly contagious and going out or to work exposes others! . Be sure to drink plenty of fluids. Water is fine as well as fruit juices, sodas and electrolyte beverages. You may want to stay away from caffeine or  alcohol. If you are nauseated, try taking small sips of liquids. How do you know if you are getting enough fluid? Your urine should be a pale yellow or almost colorless. . Get rest. . Taking a steamy shower or using a humidifier may help nasal congestion and ease sore throat pain. Using a saline nasal spray works much the same way. . Cough drops, hard candies and sore throat lozenges may ease your cough. . Line up a caregiver. Have someone check on you regularly.   GET HELP RIGHT AWAY IF: . You cannot keep down liquids or your medications. . You become short of breath . Your fell like you are going to pass out or loose consciousness. . Your symptoms persist after you have completed your treatment plan MAKE SURE YOU   Understand these instructions.  Will watch your condition.  Will get help right away if you are not doing well or get worse.  Your e-visit answers were reviewed by a board certified advanced clinical practitioner to complete your personal care plan.  Depending on the condition, your plan could have included both over the counter or prescription medications.  If there is a problem please reply  once you have received a response from your provider.  Your safety is important to us.  If you have drug allergies check your prescription carefully.    You can use MyChart to ask questions about today's visit, request a non-urgent call back, or ask   for a work or school excuse for 24 hours related to this e-Visit. If it has been greater than 24 hours you will need to follow up with your provider, or enter a new e-Visit to address those concerns.  You will get an e-mail in the next two days asking about your experience.  I hope that your e-visit has been valuable and will speed your recovery. Thank you for using e-visits.   

## 2017-08-19 ENCOUNTER — Ambulatory Visit: Payer: BLUE CROSS/BLUE SHIELD | Admitting: Psychology

## 2017-08-24 DIAGNOSIS — R3915 Urgency of urination: Secondary | ICD-10-CM | POA: Diagnosis not present

## 2017-08-24 DIAGNOSIS — N393 Stress incontinence (female) (male): Secondary | ICD-10-CM | POA: Diagnosis not present

## 2017-09-07 ENCOUNTER — Ambulatory Visit: Payer: BLUE CROSS/BLUE SHIELD | Admitting: Physician Assistant

## 2017-09-07 ENCOUNTER — Encounter: Payer: Self-pay | Admitting: Physician Assistant

## 2017-09-07 VITALS — BP 126/70 | HR 90 | Temp 98.7°F | Ht 69.0 in | Wt 232.8 lb

## 2017-09-07 DIAGNOSIS — J0111 Acute recurrent frontal sinusitis: Secondary | ICD-10-CM | POA: Diagnosis not present

## 2017-09-07 MED ORDER — AMOXICILLIN-POT CLAVULANATE 875-125 MG PO TABS: 1.0000 | ORAL_TABLET | Freq: Two times a day (BID) | ORAL | 0 refills | Status: DC

## 2017-09-07 NOTE — Patient Instructions (Signed)
It was great to see you!  You will be contacted about your referral to ENT.  Use medication as prescribed: Augmentin and Ibuprofen (as needed) Push fluids and get plenty of rest. Please return if you are not improving as expected, or if you have high fevers (>101.5) or difficulty swallowing or worsening productive cough.  Call clinic with questions.  I hope you start feeling better soon!

## 2017-09-07 NOTE — Progress Notes (Signed)
Carolyn Gilmore is a 37 y.o. female here for a new problem.  I acted as a Education administrator for [employer], PA-C Anselmo Pickler, LPN  History of Present Illness:   Chief Complaint  Patient presents with  . Sore Throat    Since 09/03/17 and today's been the worst; hurts to swallow  . Cough    since 09/04/17; productive yellowcolor  . Nasal Congestion    feels like its never went away since her last appointment    Sore Throat   This is a new problem. Episode onset: Started on Friday. The problem has been gradually worsening. There has been no fever. The pain is at a severity of 3/10. The pain is mild. Associated symptoms include coughing, ear pain, headaches, a hoarse voice and trouble swallowing. Pertinent negatives include no abdominal pain, diarrhea, ear discharge, plugged ear sensation, neck pain, shortness of breath, swollen glands or vomiting. Associated symptoms comments: Left ear pain since Sunday, sinus pressure and congestion. She has tried NSAIDs and gargles for the symptoms. The treatment provided mild relief.  Cough  This is a new problem. Episode onset: Started on Saturday. The problem has been gradually worsening. The problem occurs every few hours. The cough is productive of purulent sputum. Associated symptoms include ear pain, headaches, nasal congestion, postnasal drip and a sore throat. Pertinent negatives include no chills, ear congestion, fever or shortness of breath. The symptoms are aggravated by lying down. Risk factors: son has been sick for 2 weeks took to doctor yesterday and was started on antibiotics. Treatments tried: Alka seltzer  The treatment provided mild relief. There is no history of asthma, bronchitis or pneumonia.   She is concerned because she is having persistent ENT infections since moving to [place]. She has had multiple rounds of antibiotics for pharyngitis and sinusitis since June 2018.   Past Medical History:  Diagnosis Date  . Anxiety   . Depression   .  Elevated liver enzymes 10/09/2016   RUQ ultrasound -- showed some gallstones  . Genital warts   . Gestational diabetes 2016  . HPV in female   . Hx of gallstones 10/2016  . Hypertension   . IBS (irritable bowel syndrome)   . Melanoma ([place])    R thigh  . Melanoma of thigh, right ([place]) 2016  . PCOS (polycystic ovarian syndrome)   . Vitamin B 12 deficiency 2015  . Vitamin D deficiency 2015     Social History   Socioeconomic History  . Marital status: Married    Spouse name: Not on file  . Number of children: Not on file  . Years of education: Not on file  . Highest education level: Not on file  Social Needs  . Financial resource strain: Not on file  . Food insecurity - worry: Not on file  . Food insecurity - inability: Not on file  . Transportation needs - medical: Not on file  . Transportation needs - non-medical: Not on file  Occupational History  . Not on file  Tobacco Use  . Smoking status: Never Smoker  . Smokeless tobacco: Never Used  Substance and Sexual Activity  . Alcohol use: Yes    Comment: 2-3 wines or beer on weekend  . Drug use: No  . Sexual activity: Yes    Birth control/protection: IUD  Other Topics Concern  . Not on file  Social History Narrative   Was living in [place] in 15-16 years   Now moving here to be close to dad  Married with 1 son    Past Surgical History:  Procedure Laterality Date  . CHOLECYSTECTOMY N/A 03/19/2017   Procedure: LAPAROSCOPIC CHOLECYSTECTOMY;  Surgeon: Clovis Riley, MD;  Location: [facility];  Service: General;  Laterality: N/A;  . MOLE REMOVAL N/A 2016   Right thigh, Melanoma  in office  . WISDOM TOOTH EXTRACTION      History reviewed. No pertinent family history.  Allergies  Allergen Reactions  . Bacitracin Other (See Comments)    Blisters and pustules    Current Medications:   Current Outpatient Medications:  .  clonazePAM (KLONOPIN) 0.5 MG tablet, Take 0.125-0.25 mg by mouth daily as needed for anxiety (takes  1/4-1/2 tablet depending on anxiety level). , Disp: , Rfl:  .  DULoxetine (CYMBALTA) 30 MG capsule, Take 1 capsule (30 mg total) by mouth daily. (Patient taking differently: Take 30 mg by mouth every evening. Takes a 30 mg and 60 mg to equal 90 mg), Disp: 90 capsule, Rfl: 0 .  DULoxetine (CYMBALTA) 60 MG capsule, Take 1 capsule (60 mg total) by mouth daily. (Patient taking differently: Take 60 mg by mouth every evening. Takes  30 mg and 60 mg to equal 90 mg), Disp: 90 capsule, Rfl: 0 .  Levonorgestrel (KYLEENA) 19.5 MG IUD, by Intrauterine route. Placed July 2018, needs to be removed 2023., Disp: , Rfl:  .  amoxicillin-clavulanate (AUGMENTIN) 875-125 MG tablet, Take 1 tablet by mouth 2 (two) times daily., Disp: 20 tablet, Rfl: 0   Review of Systems:   Review of Systems  Constitutional: Negative for chills and fever.  HENT: Positive for ear pain, hoarse voice, postnasal drip, sore throat and trouble swallowing. Negative for ear discharge.   Respiratory: Positive for cough. Negative for shortness of breath.   Gastrointestinal: Negative for abdominal pain, diarrhea and vomiting.  Musculoskeletal: Negative for neck pain.  Neurological: Positive for headaches.    Vitals:   Vitals:   09/07/17 1515  BP: 126/70  Pulse: 90  Temp: 98.7 F (37.1 C)  TempSrc: Oral  SpO2: 98%  Weight: 232 lb 12.8 oz (105.6 kg)  Height: 5\' 9"  (1.753 m)     Body mass index is 34.38 kg/m.  Physical Exam:   Physical Exam  Constitutional: She appears well-developed. She is cooperative.  Non-toxic appearance. She does not have a sickly appearance. She does not appear ill. No distress.  HENT:  Head: Normocephalic and atraumatic.  Right Ear: Tympanic membrane, external ear and ear canal normal. Tympanic membrane is not erythematous, not retracted and not bulging.  Left Ear: Tympanic membrane, external ear and ear canal normal. Tympanic membrane is not erythematous, not retracted and not bulging.  Nose: Mucosal  edema and rhinorrhea present. Right sinus exhibits maxillary sinus tenderness and frontal sinus tenderness. Left sinus exhibits maxillary sinus tenderness and frontal sinus tenderness.  Mouth/Throat: Uvula is midline and mucous membranes are normal. Posterior oropharyngeal erythema present. No posterior oropharyngeal edema. Tonsils are 1+ on the right. Tonsils are 1+ on the left. No tonsillar exudate.  Eyes: Conjunctivae and lids are normal.  Neck: Trachea normal.  Cardiovascular: Normal rate, regular rhythm, S1 normal, S2 normal and normal heart sounds.  Pulmonary/Chest: Effort normal and breath sounds normal. She has no decreased breath sounds. She has no wheezes. She has no rhonchi. She has no rales.  Lymphadenopathy:    She has no cervical adenopathy.  Neurological: She is alert.  Skin: Skin is warm, dry and intact.  Psychiatric: She has a normal mood and  affect. Her speech is normal and behavior is normal.  Nursing note and vitals reviewed.   Assessment and Plan:    Krisandra was seen today for sore throat, cough and nasal congestion.  Diagnoses and all orders for this visit:  Acute recurrent frontal sinusitis No red flags on exam.  Will initiate Augmentin per orders. Discussed taking medications as prescribed. Reviewed return precautions including worsening fever, SOB, worsening cough or other concerns. Push fluids and rest. I recommend that patient follow-up if symptoms worsen or persist despite treatment x 7-10 days, sooner if needed. She would also like a referral to ENT given frequency of infections, I have placed this order today. -     Ambulatory referral to ENT  Other orders -     amoxicillin-clavulanate (AUGMENTIN) 875-125 MG tablet; Take 1 tablet by mouth 2 (two) times daily.    . Reviewed expectations re: course of current medical issues. . Discussed self-management of symptoms. . Outlined signs and symptoms indicating need for more acute intervention. . Patient verbalized  understanding and all questions were answered. . See orders for this visit as documented in the electronic medical record. . Patient received an After-Visit Summary.  CMA or LPN served as scribe during this visit. History, Physical, and Plan performed by medical provider. Documentation and orders reviewed and attested to.  Inda Coke, PA-C

## 2017-09-23 DIAGNOSIS — J342 Deviated nasal septum: Secondary | ICD-10-CM | POA: Diagnosis not present

## 2017-09-23 DIAGNOSIS — J321 Chronic frontal sinusitis: Secondary | ICD-10-CM | POA: Diagnosis not present

## 2017-09-23 DIAGNOSIS — J320 Chronic maxillary sinusitis: Secondary | ICD-10-CM | POA: Diagnosis not present

## 2017-09-23 DIAGNOSIS — J301 Allergic rhinitis due to pollen: Secondary | ICD-10-CM | POA: Diagnosis not present

## 2017-10-06 ENCOUNTER — Ambulatory Visit (INDEPENDENT_AMBULATORY_CARE_PROVIDER_SITE_OTHER): Payer: BLUE CROSS/BLUE SHIELD | Admitting: Psychology

## 2017-10-06 DIAGNOSIS — F411 Generalized anxiety disorder: Secondary | ICD-10-CM | POA: Diagnosis not present

## 2017-10-07 DIAGNOSIS — J320 Chronic maxillary sinusitis: Secondary | ICD-10-CM | POA: Diagnosis not present

## 2017-10-07 DIAGNOSIS — J301 Allergic rhinitis due to pollen: Secondary | ICD-10-CM | POA: Diagnosis not present

## 2017-10-07 DIAGNOSIS — J322 Chronic ethmoidal sinusitis: Secondary | ICD-10-CM | POA: Diagnosis not present

## 2017-11-25 ENCOUNTER — Ambulatory Visit: Payer: Self-pay | Admitting: Psychology

## 2017-12-21 ENCOUNTER — Ambulatory Visit (INDEPENDENT_AMBULATORY_CARE_PROVIDER_SITE_OTHER): Payer: BLUE CROSS/BLUE SHIELD | Admitting: Psychology

## 2017-12-21 DIAGNOSIS — F411 Generalized anxiety disorder: Secondary | ICD-10-CM

## 2017-12-21 DIAGNOSIS — F410 Panic disorder [episodic paroxysmal anxiety] without agoraphobia: Secondary | ICD-10-CM | POA: Diagnosis not present

## 2017-12-21 DIAGNOSIS — F330 Major depressive disorder, recurrent, mild: Secondary | ICD-10-CM | POA: Diagnosis not present

## 2017-12-22 DIAGNOSIS — F331 Major depressive disorder, recurrent, moderate: Secondary | ICD-10-CM | POA: Diagnosis not present

## 2017-12-22 DIAGNOSIS — F4323 Adjustment disorder with mixed anxiety and depressed mood: Secondary | ICD-10-CM | POA: Diagnosis not present

## 2017-12-22 DIAGNOSIS — F411 Generalized anxiety disorder: Secondary | ICD-10-CM | POA: Diagnosis not present

## 2018-01-17 DIAGNOSIS — F4323 Adjustment disorder with mixed anxiety and depressed mood: Secondary | ICD-10-CM | POA: Diagnosis not present

## 2018-01-17 DIAGNOSIS — F331 Major depressive disorder, recurrent, moderate: Secondary | ICD-10-CM | POA: Diagnosis not present

## 2018-01-17 DIAGNOSIS — F411 Generalized anxiety disorder: Secondary | ICD-10-CM | POA: Diagnosis not present

## 2018-01-28 ENCOUNTER — Encounter: Payer: BLUE CROSS/BLUE SHIELD | Admitting: Physician Assistant

## 2018-02-01 DIAGNOSIS — D229 Melanocytic nevi, unspecified: Secondary | ICD-10-CM | POA: Diagnosis not present

## 2018-02-01 DIAGNOSIS — D225 Melanocytic nevi of trunk: Secondary | ICD-10-CM | POA: Diagnosis not present

## 2018-02-01 DIAGNOSIS — Q828 Other specified congenital malformations of skin: Secondary | ICD-10-CM | POA: Diagnosis not present

## 2018-02-01 DIAGNOSIS — D1801 Hemangioma of skin and subcutaneous tissue: Secondary | ICD-10-CM | POA: Diagnosis not present

## 2018-02-04 ENCOUNTER — Ambulatory Visit (INDEPENDENT_AMBULATORY_CARE_PROVIDER_SITE_OTHER): Payer: BLUE CROSS/BLUE SHIELD | Admitting: Psychology

## 2018-02-04 ENCOUNTER — Encounter: Payer: Self-pay | Admitting: Physician Assistant

## 2018-02-04 ENCOUNTER — Ambulatory Visit (INDEPENDENT_AMBULATORY_CARE_PROVIDER_SITE_OTHER): Payer: BLUE CROSS/BLUE SHIELD | Admitting: Physician Assistant

## 2018-02-04 VITALS — BP 122/68 | HR 85 | Temp 98.4°F | Ht 69.0 in | Wt 226.4 lb

## 2018-02-04 DIAGNOSIS — T8189XA Other complications of procedures, not elsewhere classified, initial encounter: Secondary | ICD-10-CM

## 2018-02-04 DIAGNOSIS — Z13220 Encounter for screening for lipoid disorders: Secondary | ICD-10-CM

## 2018-02-04 DIAGNOSIS — Z136 Encounter for screening for cardiovascular disorders: Secondary | ICD-10-CM

## 2018-02-04 DIAGNOSIS — F329 Major depressive disorder, single episode, unspecified: Secondary | ICD-10-CM | POA: Insufficient documentation

## 2018-02-04 DIAGNOSIS — F411 Generalized anxiety disorder: Secondary | ICD-10-CM

## 2018-02-04 DIAGNOSIS — E669 Obesity, unspecified: Secondary | ICD-10-CM

## 2018-02-04 DIAGNOSIS — E559 Vitamin D deficiency, unspecified: Secondary | ICD-10-CM | POA: Diagnosis not present

## 2018-02-04 DIAGNOSIS — Z0001 Encounter for general adult medical examination with abnormal findings: Secondary | ICD-10-CM | POA: Diagnosis not present

## 2018-02-04 DIAGNOSIS — F419 Anxiety disorder, unspecified: Secondary | ICD-10-CM | POA: Insufficient documentation

## 2018-02-04 DIAGNOSIS — C439 Malignant melanoma of skin, unspecified: Secondary | ICD-10-CM | POA: Insufficient documentation

## 2018-02-04 DIAGNOSIS — E538 Deficiency of other specified B group vitamins: Secondary | ICD-10-CM

## 2018-02-04 DIAGNOSIS — F32A Depression, unspecified: Secondary | ICD-10-CM

## 2018-02-04 DIAGNOSIS — K589 Irritable bowel syndrome without diarrhea: Secondary | ICD-10-CM | POA: Insufficient documentation

## 2018-02-04 NOTE — Patient Instructions (Signed)
It was great to see you! ? ?Please make an appointment with the lab on your way out. I would like for you to return for lab work within 1-2 weeks. After midnight on the day of the lab draw, please do not eat anything. You may have water, black coffee, unsweetened tea. ? ?Take care, ? ?Allyn Bartelson PA-C  ?

## 2018-02-04 NOTE — Progress Notes (Signed)
Subjective:    Carolyn Gilmore is a 37 y.o. female and is here for a comprehensive physical exam.  HPI  Health Maintenance Due  Topic Date Due  . INFLUENZA VACCINE  01/27/2018    Acute Concerns: Gallbladder incision irritation --she noticed last week that 1 of the sites were her gallbladder incisions were purulent discharge with odor.  Her gallbladder surgery was last fall.  Fall when she had her age she did not have any issues with her scars healing. Denies fevers, chills, erythema.  Chronic Issues: Vit B12 deficiency -- she does not take a regular supplementation Vit D deficiency -- she does not take a regular supplementation Anxiety and depression -- seeing a psychiatrist and is now on 90 mg Cymbalta, 10 mg Evekeo and CBD oil.  She is trying to not use the grogginess the day after she takes it. Obesity --she reports that she has lost 30 pounds, she is doing weight watchers with her husband.  Health Maintenance: Immunizations -- UTD Colonoscopy -- n/a Mammogram -- did have mammogram in February to investigate breast lump, normal PAP -- normal and UTD per patient Diet -- husband and her are doing Weight Watchers Caffeine intake -- no excessive caffeine intake Sleep habits -- good Exercise -- going to the pool Current Weight -- Weight: 226 lb 6.4 oz (102.7 kg)  Weight History: Wt Readings from Last 10 Encounters:  02/04/18 226 lb 6.4 oz (102.7 kg)  09/07/17 232 lb 12.8 oz (105.6 kg)  03/17/17 242 lb 4.8 oz (109.9 kg)  03/17/17 230 lb (104.3 kg)  03/05/17 242 lb 4 oz (109.9 kg)  02/01/17 234 lb 9.6 oz (106.4 kg)  12/03/16 231 lb (104.8 kg)   Mood -- good No LMP recorded. (Menstrual status: IUD). Period characteristics -- spotting with IUD  Depression screen [facility] 2/9 02/04/2018  Decreased Interest 0  Down, Depressed, Hopeless 0  PHQ - 2 Score 0  Altered sleeping -  Tired, decreased energy -  Change in appetite -  Feeling bad or failure about yourself  -  Trouble  concentrating -  Moving slowly or fidgety/restless -  Suicidal thoughts -  PHQ-9 Score -    PMHx, SurgHx, SocialHx, Medications, and Allergies were reviewed in the Visit Navigator and updated as appropriate.   Past Medical History:  Diagnosis Date  . Anxiety   . Depression   . Elevated liver enzymes 10/09/2016   RUQ ultrasound -- showed some gallstones  . Genital warts   . Gestational diabetes 2016  . HPV in female   . Hx of gallstones 10/2016  . Hypertension   . IBS (irritable bowel syndrome)   . Melanoma ([place])    R thigh  . Melanoma of thigh, right ([place]) 2016  . PCOS (polycystic ovarian syndrome)   . Vitamin B 12 deficiency 2015  . Vitamin D deficiency 2015     Past Surgical History:  Procedure Laterality Date  . CHOLECYSTECTOMY N/A 03/19/2017   Procedure: LAPAROSCOPIC CHOLECYSTECTOMY;  Surgeon: Clovis Riley, MD;  Location: [facility];  Service: General;  Laterality: N/A;  . MOLE REMOVAL N/A 2016   Right thigh, Melanoma  in office  . WISDOM TOOTH EXTRACTION      History reviewed. No pertinent family history.  Social History   Tobacco Use  . Smoking status: Never Smoker  . Smokeless tobacco: Never Used  Substance Use Topics  . Alcohol use: Yes    Comment: 2-3 wines or beer on weekend  . Drug  use: No    Review of Systems:   Review of Systems  Constitutional: Negative for chills, fever, malaise/fatigue and weight loss.  HENT: Negative for hearing loss, sinus pain and sore throat.   Respiratory: Negative for cough and hemoptysis.   Cardiovascular: Negative for chest pain, palpitations, leg swelling and PND.  Gastrointestinal: Negative for abdominal pain, constipation, diarrhea, heartburn and vomiting.  Genitourinary: Negative for dysuria, frequency and urgency.  Musculoskeletal: Negative for back pain, myalgias and neck pain.  Skin: Negative for itching and rash.  Neurological: Negative for dizziness, tingling, seizures and headaches.  Endo/Heme/Allergies:  Negative for polydipsia.  Psychiatric/Behavioral: Negative for depression. The patient is not nervous/anxious.     Objective:   BP 122/68 (BP Location: Left Arm, Patient Position: Sitting, Cuff Size: Normal)   Pulse 85   Temp 98.4 F (36.9 C) (Oral)   Ht 5\' 9"  (1.753 m)   Wt 226 lb 6.4 oz (102.7 kg)   SpO2 98%   BMI 33.43 kg/m   General Appearance:    Alert, cooperative, no distress, appears stated age  Head:    Normocephalic, without obvious abnormality, atraumatic  Eyes:    PERRL, conjunctiva/corneas clear, EOM's intact, fundi    benign, both eyes  Ears:    Normal TM's and external ear canals, both ears  Nose:   Nares normal, septum midline, mucosa normal, no drainage    or sinus tenderness  Throat:   Lips, mucosa, and tongue normal; teeth and gums normal  Neck:   Supple, symmetrical, trachea midline, no adenopathy;    thyroid:  no enlargement/tenderness/nodules; no carotid   bruit or JVD  Back:     Symmetric, no curvature, ROM normal, no CVA tenderness  Lungs:     Clear to auscultation bilaterally, respirations unlabored  Chest Wall:    No tenderness or deformity   Heart:    Regular rate and rhythm, S1 and S2 normal, no murmur, rub   or gallop  Breast Exam:    Deferred  Abdomen:     Soft, non-tender, bowel sounds active all four quadrants,    no masses, no organomegaly  Genitalia:    Deferred  Rectal:    Deferred  Extremities:   Extremities normal, atraumatic, no cyanosis or edema  Pulses:   2+ and symmetric all extremities  Skin:   Skin color, texture, turgor normal, no rashes or lesions Prior laparoscopic incision inferior to umbilicus with keloid scarring and scant clear discharge -- no odor  Lymph nodes:   Cervical, supraclavicular, and axillary nodes normal  Neurologic:   CNII-XII intact, normal strength, sensation and reflexes    throughout    Assessment/Plan:   Carolyn Gilmore was seen today for annual exam.  Diagnoses and all orders for this visit:  Encounter for  general adult medical examination with abnormal findings Today patient counseled on age appropriate routine health concerns for screening and prevention, each reviewed and up to date or declined. Immunizations reviewed and up to date or declined. Labs ordered and reviewed. Risk factors for depression reviewed and negative. Hearing function and visual acuity are intact. ADLs screened and addressed as needed. Functional ability and level of safety reviewed and appropriate. Education, counseling and referrals performed based on assessed risks today. Patient provided with a copy of personalized plan for preventive services.  Depression, unspecified depression type and Anxiety Currently well controlled, management per psychiatry.  Encounter for lipid screening for cardiovascular disease  Vitamin D deficiency Re-check labs.  Vitamin B 12 deficiency she  does not take a regular supplementation  Obesity Continue Weight Watchers and exercise as able. Encouraged efforts.   Incisional irritation, initial encounter No evidence of infection, however I did recommend that she stop using Neosporin and start using Bactroban.  She reports that she has Bactroban at home.  Follow-up if symptoms worsen or persist despite treatment.   Well Adult Exam: Labs ordered: Yes. Patient counseling was done. See below for items discussed. Discussed the patient's BMI. The BMI BMI is not in the acceptable range; BMI management plan is completed Follow up in one year.  Patient Counseling:   [x]     Nutrition: Stressed importance of moderation in sodium/caffeine intake, saturated fat and cholesterol, caloric balance, sufficient intake of fresh fruits, vegetables, fiber, calcium, iron, and 1 mg of folate supplement per day (for females capable of pregnancy).   [x]      Stressed the importance of regular exercise.    [x]     Substance Abuse: Discussed cessation/primary prevention of tobacco, alcohol, or other drug use;  driving or other dangerous activities under the influence; availability of treatment for abuse.    [x]      Injury prevention: Discussed safety belts, safety helmets, smoke detector, smoking near bedding or upholstery.    [x]      Sexuality: Discussed sexually transmitted diseases, partner selection, use of condoms, avoidance of unintended pregnancy  and contraceptive alternatives.    [x]     Dental health: Discussed importance of regular tooth brushing, flossing, and dental visits.   [x]      Health maintenance and immunizations reviewed. Please refer to Health maintenance section.    Inda Coke, PA-C [place]

## 2018-02-08 ENCOUNTER — Other Ambulatory Visit (INDEPENDENT_AMBULATORY_CARE_PROVIDER_SITE_OTHER): Payer: BLUE CROSS/BLUE SHIELD

## 2018-02-08 DIAGNOSIS — Z13220 Encounter for screening for lipoid disorders: Secondary | ICD-10-CM | POA: Diagnosis not present

## 2018-02-08 DIAGNOSIS — E559 Vitamin D deficiency, unspecified: Secondary | ICD-10-CM | POA: Diagnosis not present

## 2018-02-08 DIAGNOSIS — F419 Anxiety disorder, unspecified: Secondary | ICD-10-CM

## 2018-02-08 DIAGNOSIS — F32A Depression, unspecified: Secondary | ICD-10-CM

## 2018-02-08 DIAGNOSIS — F329 Major depressive disorder, single episode, unspecified: Secondary | ICD-10-CM | POA: Diagnosis not present

## 2018-02-08 DIAGNOSIS — E538 Deficiency of other specified B group vitamins: Secondary | ICD-10-CM | POA: Diagnosis not present

## 2018-02-08 DIAGNOSIS — Z136 Encounter for screening for cardiovascular disorders: Secondary | ICD-10-CM

## 2018-02-08 DIAGNOSIS — E669 Obesity, unspecified: Secondary | ICD-10-CM | POA: Diagnosis not present

## 2018-02-08 LAB — LIPID PANEL
CHOL/HDL RATIO: 3
Cholesterol: 171 mg/dL (ref 0–200)
HDL: 50.5 mg/dL (ref >39.00)
LDL CALC: 107 mg/dL — AB (ref 0–99)
NONHDL: 120.09
Triglycerides: 65 mg/dL (ref 0.0–149.0)
VLDL: 13 mg/dL (ref 0.0–40.0)

## 2018-02-08 LAB — CBC
HCT: 40.6 % (ref 36.0–46.0)
Hemoglobin: 14 g/dL (ref 12.0–15.0)
MCHC: 34.5 g/dL (ref 30.0–36.0)
MCV: 86.8 fl (ref 78.0–100.0)
Platelets: 319 10*3/uL (ref 150.0–400.0)
RBC: 4.68 Mil/uL (ref 3.87–5.11)
RDW: 12.9 % (ref 11.5–15.5)
WBC: 7.9 10*3/uL (ref 4.0–10.5)

## 2018-02-08 LAB — COMPREHENSIVE METABOLIC PANEL
ALT: 22 U/L (ref 0–35)
AST: 13 U/L (ref 0–37)
Albumin: 4.8 g/dL (ref 3.5–5.2)
Alkaline Phosphatase: 74 U/L (ref 39–117)
BILIRUBIN TOTAL: 0.6 mg/dL (ref 0.2–1.2)
BUN: 16 mg/dL (ref 6–23)
CHLORIDE: 102 meq/L (ref 96–112)
CO2: 30 mEq/L (ref 19–32)
CREATININE: 0.65 mg/dL (ref 0.40–1.20)
Calcium: 9.9 mg/dL (ref 8.4–10.5)
GFR: 109.66 mL/min (ref >60.00)
Glucose, Bld: 99 mg/dL (ref 70–99)
Potassium: 4.6 mEq/L (ref 3.5–5.1)
SODIUM: 138 meq/L (ref 135–145)
TOTAL PROTEIN: 7.3 g/dL (ref 6.0–8.3)

## 2018-02-08 LAB — VITAMIN B12: VITAMIN B 12: 329 pg/mL (ref 211–911)

## 2018-02-08 LAB — HEMOGLOBIN A1C: HEMOGLOBIN A1C: 5.9 % (ref 4.6–6.5)

## 2018-02-08 LAB — VITAMIN D 25 HYDROXY (VIT D DEFICIENCY, FRACTURES): VITD: 36.93 ng/mL (ref 30.00–100.00)

## 2018-03-15 DIAGNOSIS — F4323 Adjustment disorder with mixed anxiety and depressed mood: Secondary | ICD-10-CM | POA: Diagnosis not present

## 2018-03-15 DIAGNOSIS — F411 Generalized anxiety disorder: Secondary | ICD-10-CM | POA: Diagnosis not present

## 2018-03-15 DIAGNOSIS — F331 Major depressive disorder, recurrent, moderate: Secondary | ICD-10-CM | POA: Diagnosis not present

## 2018-03-15 DIAGNOSIS — F902 Attention-deficit hyperactivity disorder, combined type: Secondary | ICD-10-CM | POA: Diagnosis not present

## 2018-03-23 ENCOUNTER — Ambulatory Visit: Payer: BLUE CROSS/BLUE SHIELD | Admitting: Psychology

## 2018-05-03 ENCOUNTER — Ambulatory Visit (INDEPENDENT_AMBULATORY_CARE_PROVIDER_SITE_OTHER): Payer: BLUE CROSS/BLUE SHIELD | Admitting: Psychology

## 2018-05-03 DIAGNOSIS — F411 Generalized anxiety disorder: Secondary | ICD-10-CM

## 2018-06-07 DIAGNOSIS — F902 Attention-deficit hyperactivity disorder, combined type: Secondary | ICD-10-CM | POA: Diagnosis not present

## 2018-06-07 DIAGNOSIS — F4323 Adjustment disorder with mixed anxiety and depressed mood: Secondary | ICD-10-CM | POA: Diagnosis not present

## 2018-06-07 DIAGNOSIS — F411 Generalized anxiety disorder: Secondary | ICD-10-CM | POA: Diagnosis not present

## 2018-06-07 DIAGNOSIS — F331 Major depressive disorder, recurrent, moderate: Secondary | ICD-10-CM | POA: Diagnosis not present

## 2018-06-15 ENCOUNTER — Ambulatory Visit: Payer: BLUE CROSS/BLUE SHIELD | Admitting: Psychology

## 2018-07-12 ENCOUNTER — Ambulatory Visit: Payer: BLUE CROSS/BLUE SHIELD | Admitting: Psychology

## 2018-07-12 DIAGNOSIS — F411 Generalized anxiety disorder: Secondary | ICD-10-CM | POA: Diagnosis not present

## 2018-08-02 DIAGNOSIS — Z01419 Encounter for gynecological examination (general) (routine) without abnormal findings: Secondary | ICD-10-CM | POA: Diagnosis not present

## 2018-08-02 DIAGNOSIS — Z6837 Body mass index (BMI) 37.0-37.9, adult: Secondary | ICD-10-CM | POA: Diagnosis not present

## 2018-08-30 ENCOUNTER — Ambulatory Visit (INDEPENDENT_AMBULATORY_CARE_PROVIDER_SITE_OTHER): Payer: BLUE CROSS/BLUE SHIELD | Admitting: Psychology

## 2018-08-30 DIAGNOSIS — F411 Generalized anxiety disorder: Secondary | ICD-10-CM | POA: Diagnosis not present

## 2018-09-06 DIAGNOSIS — F411 Generalized anxiety disorder: Secondary | ICD-10-CM | POA: Diagnosis not present

## 2018-09-06 DIAGNOSIS — F902 Attention-deficit hyperactivity disorder, combined type: Secondary | ICD-10-CM | POA: Diagnosis not present

## 2018-09-06 DIAGNOSIS — F4323 Adjustment disorder with mixed anxiety and depressed mood: Secondary | ICD-10-CM | POA: Diagnosis not present

## 2018-09-06 DIAGNOSIS — F331 Major depressive disorder, recurrent, moderate: Secondary | ICD-10-CM | POA: Diagnosis not present

## 2018-10-12 ENCOUNTER — Ambulatory Visit (INDEPENDENT_AMBULATORY_CARE_PROVIDER_SITE_OTHER): Payer: BLUE CROSS/BLUE SHIELD | Admitting: Psychology

## 2018-10-12 DIAGNOSIS — F411 Generalized anxiety disorder: Secondary | ICD-10-CM

## 2018-11-11 ENCOUNTER — Ambulatory Visit: Payer: BLUE CROSS/BLUE SHIELD | Admitting: Psychology

## 2018-12-07 DIAGNOSIS — F4323 Adjustment disorder with mixed anxiety and depressed mood: Secondary | ICD-10-CM | POA: Diagnosis not present

## 2018-12-07 DIAGNOSIS — F331 Major depressive disorder, recurrent, moderate: Secondary | ICD-10-CM | POA: Diagnosis not present

## 2018-12-07 DIAGNOSIS — F902 Attention-deficit hyperactivity disorder, combined type: Secondary | ICD-10-CM | POA: Diagnosis not present

## 2018-12-07 DIAGNOSIS — F411 Generalized anxiety disorder: Secondary | ICD-10-CM | POA: Diagnosis not present

## 2018-12-16 ENCOUNTER — Ambulatory Visit: Payer: BLUE CROSS/BLUE SHIELD | Admitting: Physician Assistant

## 2018-12-16 ENCOUNTER — Other Ambulatory Visit: Payer: Self-pay

## 2018-12-16 ENCOUNTER — Ambulatory Visit (INDEPENDENT_AMBULATORY_CARE_PROVIDER_SITE_OTHER): Payer: BC Managed Care – PPO

## 2018-12-16 ENCOUNTER — Encounter: Payer: Self-pay | Admitting: Physician Assistant

## 2018-12-16 VITALS — BP 110/70 | HR 81 | Temp 98.6°F | Ht 69.0 in | Wt 251.2 lb

## 2018-12-16 DIAGNOSIS — M25562 Pain in left knee: Secondary | ICD-10-CM

## 2018-12-16 DIAGNOSIS — R7989 Other specified abnormal findings of blood chemistry: Secondary | ICD-10-CM

## 2018-12-16 DIAGNOSIS — M545 Low back pain, unspecified: Secondary | ICD-10-CM

## 2018-12-16 DIAGNOSIS — R945 Abnormal results of liver function studies: Secondary | ICD-10-CM

## 2018-12-16 DIAGNOSIS — M25462 Effusion, left knee: Secondary | ICD-10-CM | POA: Diagnosis not present

## 2018-12-16 LAB — COMPREHENSIVE METABOLIC PANEL
ALT: 38 U/L — ABNORMAL HIGH (ref 0–35)
AST: 16 U/L (ref 0–37)
Albumin: 4.3 g/dL (ref 3.5–5.2)
Alkaline Phosphatase: 99 U/L (ref 39–117)
BUN: 12 mg/dL (ref 6–23)
CO2: 27 mEq/L (ref 19–32)
Calcium: 9.2 mg/dL (ref 8.4–10.5)
Chloride: 104 mEq/L (ref 96–112)
Creatinine, Ser: 0.56 mg/dL (ref 0.40–1.20)
GFR: 121.95 mL/min (ref >60.00)
Glucose, Bld: 100 mg/dL — ABNORMAL HIGH (ref 70–99)
Potassium: 4.3 mEq/L (ref 3.5–5.1)
Sodium: 139 mEq/L (ref 135–145)
Total Bilirubin: 0.5 mg/dL (ref 0.2–1.2)
Total Protein: 6.5 g/dL (ref 6.0–8.3)

## 2018-12-16 MED ORDER — MELOXICAM 15 MG PO TABS: 15.0000 mg | ORAL_TABLET | Freq: Every day | ORAL | 0 refills | Status: DC

## 2018-12-16 NOTE — Progress Notes (Signed)
Carolyn Gilmore is a 38 y.o. female here for a new problem.  I acted as a Education administrator for [employer], PA-C Anselmo Pickler, LPN  History of Present Illness:   Chief Complaint  Patient presents with  . Knee Pain  . Back spasms    HPI   Knee pain Pt c/o left knee pain x 1 week, knee is swollen, hurts with walking and climbing stairs. Denies any inciting event. Knee "almost" gave out on her yesterday. Using Ibuprofen 400 mg with relief. She is also using a knee brace/stabilizer. Denies calf pain, numbness or tingling.  Back spasms Pt c/o back spasms off and on x 1 month when at work on feet for long time. Pt has not tried anything for spasms. Occurs on L lower side. Does not radiate, does not get numbness/tingling to her legs.  Elevated LFTs She states that she had to do labs for [employer] and was told her AST was around 50 and her ALT was over 100. Denies excessive ETOH or tylenol intake, hepatitis concerns.   Past Medical History:  Diagnosis Date  . Anxiety   . Depression   . Elevated liver enzymes 10/09/2016   RUQ ultrasound -- showed some gallstones  . Genital warts   . Gestational diabetes 2016  . HPV in female   . Hx of gallstones 10/2016  . Hypertension   . IBS (irritable bowel syndrome)   . Melanoma ([facility])    R thigh  . Melanoma of thigh, right ([facility]) 2016  . PCOS (polycystic ovarian syndrome)   . Vitamin B 12 deficiency 2015  . Vitamin D deficiency 2015     Social History   Socioeconomic History  . Marital status: Married    Spouse name: Not on file  . Number of children: Not on file  . Years of education: Not on file  . Highest education level: Not on file  Occupational History  . Not on file  Social Needs  . Financial resource strain: Not on file  . Food insecurity    Worry: Not on file    Inability: Not on file  . Transportation needs    Medical: Not on file    Non-medical: Not on file  Tobacco Use  . Smoking status: Never Smoker  . Smokeless  tobacco: Never Used  Substance and Sexual Activity  . Alcohol use: Yes    Comment: 2-3 wines or beer on weekend  . Drug use: No  . Sexual activity: Yes    Birth control/protection: I.U.D.  Lifestyle  . Physical activity    Days per week: Not on file    Minutes per session: Not on file  . Stress: Not on file  Relationships  . Social Herbalist on phone: Not on file    Gets together: Not on file    Attends religious service: Not on file    Active member of club or organization: Not on file    Attends meetings of clubs or organizations: Not on file    Relationship status: Not on file  . Intimate partner violence    Fear of current or ex partner: Not on file    Emotionally abused: Not on file    Physically abused: Not on file    Forced sexual activity: Not on file  Other Topics Concern  . Not on file  Social History Narrative   Was living in [place] in 15-16 years   Now moving here to be close  to dad   Married with 1 son    Past Surgical History:  Procedure Laterality Date  . CHOLECYSTECTOMY N/A 03/19/2017   Procedure: LAPAROSCOPIC CHOLECYSTECTOMY;  Surgeon: Clovis Riley, MD;  Location: [facility];  Service: General;  Laterality: N/A;  . MOLE REMOVAL N/A 2016   Right thigh, Melanoma  in office  . WISDOM TOOTH EXTRACTION      History reviewed. No pertinent family history.  Allergies  Allergen Reactions  . Bacitracin Other (See Comments)    Blisters and pustules    Current Medications:   Current Outpatient Medications:  .  Amphetamine Sulfate (EVEKEO) 10 MG TABS, Take 10 mg by mouth daily., Disp: , Rfl:  .  Ascorbic Acid (VITAMIN C) 1000 MG tablet, Vitamin C, Disp: , Rfl:  .  Calcium Carbonate (CALCIUM 500 PO), Take 1 tablet by mouth daily., Disp: , Rfl:  .  clonazePAM (KLONOPIN) 0.5 MG tablet, Take 0.125-0.25 mg by mouth daily as needed for anxiety (takes 1/4-1/2 tablet depending on anxiety level). , Disp: , Rfl:  .  Cyanocobalamin (VITAMIN B12 PO), Vitamin  B12, Disp: , Rfl:  .  DULoxetine (CYMBALTA) 60 MG capsule, Take 1 capsule (60 mg total) by mouth daily. (Patient taking differently: Take 60 mg by mouth every evening. Takes  30 mg and 60 mg to equal 90 mg), Disp: 90 capsule, Rfl: 0 .  Levonorgestrel (KYLEENA) 19.5 MG IUD, by Intrauterine route. Placed July 2018, needs to be removed 2023., Disp: , Rfl:  .  Multiple Vitamin (MULTIVITAMIN) tablet, Take 1 tablet by mouth daily., Disp: , Rfl:  .  Omega-3 Fatty Acids (FISH OIL) 1000 MG CAPS, Fish Oil, Disp: , Rfl:  .  VITAMIN D, CHOLECALCIFEROL, PO, Take 1,000 Units by mouth daily., Disp: , Rfl:  .  meloxicam (MOBIC) 15 MG tablet, Take 1 tablet (15 mg total) by mouth daily., Disp: 30 tablet, Rfl: 0   Review of Systems:   Review of Systems  Constitutional: Negative for chills, fever, malaise/fatigue and weight loss.  Respiratory: Negative for shortness of breath.   Cardiovascular: Negative for chest pain, orthopnea, claudication and leg swelling.  Gastrointestinal: Negative for heartburn, nausea and vomiting.  Musculoskeletal: Positive for back pain and joint pain.  Neurological: Negative for dizziness, tingling and headaches.    Vitals:   Vitals:   12/16/18 0854  BP: 110/70  Pulse: 81  Temp: 98.6 F (37 C)  TempSrc: Oral  SpO2: 96%  Weight: 251 lb 4 oz (114 kg)  Height: 5\' 9"  (1.753 m)     Body mass index is 37.1 kg/m.  Physical Exam:   Physical Exam Constitutional:      Appearance: She is well-developed.  HENT:     Head: Normocephalic and atraumatic.  Eyes:     Conjunctiva/sclera: Conjunctivae normal.  Neck:     Musculoskeletal: Normal range of motion and neck supple.  Pulmonary:     Effort: Pulmonary effort is normal.  Musculoskeletal: Normal range of motion.     Comments: No decreased ROM 2/2 pain with flexion/extension, lateral side bends, or rotation. No reproducible tenderness with deep palpation to bilateral paraspinal muscles. No bony tenderness. No evidence of  erythema, rash or ecchymosis. Negative STLR bilaterally.  Left Knee: Overall joint is well aligned, no significant deformity Does appear to have mild amount of fluid in lateral and medial joint lines. ROM decreased with flexion. No significant point tenderness.   Skin:    General: Skin is warm and dry.  Neurological:  Mental Status: She is alert and oriented to person, place, and time.  Psychiatric:        Behavior: Behavior normal.        Thought Content: Thought content normal.        Judgment: Judgment normal.      Assessment and Plan:   Romell was seen today for knee pain and back spasms.  Diagnoses and all orders for this visit:  Elevated LFTs Recheck labs and will determine if further work-up is indicated. -     Comprehensive metabolic panel  Acute pain of left knee Will obtain xray and decide best course of action. I recommended RICE, especially compression to see if this will help with fluid. Mobic daily. I also discussed that we may need to send her somewhere to get her knee drained if persists. -     DG Knee Complete 4 Views Left; Future  Acute back pain Sounds like SI joint intermittent spasms. She declines medication. Recommended weight loss, core strengthening exercises, and possible referral to PT if persists.  Other orders -     meloxicam (MOBIC) 15 MG tablet; Take 1 tablet (15 mg total) by mouth daily.  . Reviewed expectations re: course of current medical issues. . Discussed self-management of symptoms. . Outlined signs and symptoms indicating need for more acute intervention. . Patient verbalized understanding and all questions were answered. . See orders for this visit as documented in the electronic medical record. . Patient received an After-Visit Summary.  CMA or LPN served as scribe during this visit. History, Physical, and Plan performed by medical provider. The above documentation has been reviewed and is accurate and complete.  Inda Coke, PA-C

## 2018-12-16 NOTE — Patient Instructions (Signed)
It was great to see you!  Try a compression sleeve for your swelling. Ice and elevation are your friend. I have prescribed daily anti-inflammatory, Mobic, for you to take for the inflammation.  For your back, continue to work on weight loss and strengthening your core. We can always try to see if PT can give [place] some good options. Let me know.  We will recheck your liver enzymes and determine next best step.   I will be in touch with your xray as well. If it looks like there is a lot of fluid, we may need to send you to a sports medicine or orthopedist to see if they can drain the fluid.  Take care,  Inda Coke PA-C

## 2018-12-19 ENCOUNTER — Other Ambulatory Visit: Payer: Self-pay | Admitting: *Deleted

## 2018-12-19 DIAGNOSIS — M25562 Pain in left knee: Secondary | ICD-10-CM

## 2018-12-22 ENCOUNTER — Ambulatory Visit (INDEPENDENT_AMBULATORY_CARE_PROVIDER_SITE_OTHER): Payer: BC Managed Care – PPO | Admitting: Orthopaedic Surgery

## 2018-12-22 ENCOUNTER — Other Ambulatory Visit: Payer: Self-pay

## 2018-12-22 ENCOUNTER — Encounter: Payer: Self-pay | Admitting: Orthopaedic Surgery

## 2018-12-22 DIAGNOSIS — M25462 Effusion, left knee: Secondary | ICD-10-CM | POA: Diagnosis not present

## 2018-12-22 MED ORDER — LIDOCAINE HCL 1 % IJ SOLN
2.0000 mL | INTRAMUSCULAR | Status: AC | PRN
  Administered 2018-12-22: 2 mL

## 2018-12-22 MED ORDER — BUPIVACAINE HCL 0.25 % IJ SOLN
2.0000 mL | INTRAMUSCULAR | Status: AC | PRN
  Administered 2018-12-22: 2 mL via INTRA_ARTICULAR

## 2018-12-22 MED ORDER — METHYLPREDNISOLONE ACETATE 40 MG/ML IJ SUSP
40.0000 mg | INTRAMUSCULAR | Status: AC | PRN
  Administered 2018-12-22: 40 mg via INTRA_ARTICULAR

## 2018-12-22 NOTE — Progress Notes (Signed)
Office Visit Note   Patient: Carolyn Gilmore           Date of Birth: May 23, 1981           MRN: [id number] Visit Date: 12/22/2018              Requested by: Inda Coke, [place] [address] [place],  [place] [id number] PCP: Inda Coke, [place]   Assessment & Plan: Visit Diagnoses:  1. Effusion, left knee     Plan: Impression is left knee effusion with associated instability.  Today, we aspirated approximately 25 cc of serosanguineous fluid from the left knee.  We then injected the left knee with cortisone.  We will send the aspirate off for cell count and crystals.  She will follow-up with [place] as needed.  Follow-Up Instructions: Return if symptoms worsen or fail to improve.   Orders:  Orders Placed This Encounter  Procedures  . Large Joint Inj: L knee   No orders of the defined types were placed in this encounter.     Procedures: Large Joint Inj: L knee on 12/22/2018 9:00 AM Indications: pain Details: 22 G needle, anterolateral approach Medications: 2 mL bupivacaine 0.25 %; 2 mL lidocaine 1 %; 40 mg methylPREDNISolone acetate 40 MG/ML      Clinical Data: No additional findings.   Subjective: Chief Complaint  Patient presents with  . 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  No known injury or change in activity.  Her pain did start when she returned from a trip back from [place] with her son.  She really has minimal if any pain.  Issue is more of stiffness.  She does note a recent episode where her knee nearly gave out while she was going down a set of stairs.  This caused her to see her PCP.  X-rays were obtained which showed moderate effusion.  Otherwise, no acute findings.  She comes in today for further evaluation and treatment recommendation.  She denies any locking or catching.  Slight instability going downstairs.  She has not taking any medication for this.   She has used a compression sleeve and ice which does seem to have helped some.  No previous injury to either knee.  No history of gout or pseudogout.  No fevers or chills.  Review of Systems as detailed in HPI.  All others reviewed and are negative.   Objective: Vital Signs: There were no vitals taken for this visit.  Physical Exam well-developed well-nourished female no acute distress.  Alert and oriented x3.  Ortho Exam examination of the left knee reveals a moderate effusion.  Range of motion 0 to 110 degrees.  No joint line tenderness.  No patellofemoral crepitus.  Ligaments are stable.  She is neurovascular intact distally.  Specialty Comments:  No specialty comments available.  Imaging: No new imaging   PMFS History: Patient Active Problem List   Diagnosis Date Noted  . Effusion, left knee 12/22/2018  . Melanoma ([facility])   . IBS (irritable bowel syndrome)   . Depression   . Anxiety   . Melanoma of thigh, right ([facility]) 06/29/2014  . Vitamin D deficiency 06/29/2013  . Vitamin B 12 deficiency 06/29/2013   Past Medical History:  Diagnosis Date  . Anxiety   . Depression   . Elevated liver enzymes 10/09/2016   RUQ ultrasound -- showed some  gallstones  . Genital warts   . Gestational diabetes 2016  . HPV in female   . Hx of gallstones 10/2016  . Hypertension   . IBS (irritable bowel syndrome)   . Melanoma ([facility])    R thigh  . Melanoma of thigh, right ([facility]) 2016  . PCOS (polycystic ovarian syndrome)   . Vitamin B 12 deficiency 2015  . Vitamin D deficiency 2015    History reviewed. No pertinent family history.  Past Surgical History:  Procedure Laterality Date  . CHOLECYSTECTOMY N/A 03/19/2017   Procedure: LAPAROSCOPIC CHOLECYSTECTOMY;  Surgeon: Clovis Riley, MD;  Location: [facility];  Service: General;  Laterality: N/A;  . MOLE REMOVAL N/A 2016   Right thigh, Melanoma  in office  . WISDOM TOOTH EXTRACTION     Social History   Occupational History  . Not on file   Tobacco Use  . Smoking status: Never Smoker  . Smokeless tobacco: Never Used  Substance and Sexual Activity  . Alcohol use: Yes    Comment: 2-3 wines or beer on weekend  . Drug use: No  . Sexual activity: Yes    Birth control/protection: I.U.D.

## 2018-12-23 LAB — SYNOVIAL CELL COUNT + DIFF, W/ CRYSTALS
Basophils, %: 0 %
Eosinophils-Synovial: 1 % (ref 0–2)
Lymphocytes-Synovial Fld: 33 % (ref 0–74)
Monocyte/Macrophage: 62 % (ref 0–69)
Neutrophil, Synovial: 4 % (ref 0–24)
Synoviocytes, %: 0 % (ref 0–15)
WBC, Synovial: 191 cells/uL — ABNORMAL HIGH (ref <150)

## 2018-12-26 NOTE — Progress Notes (Signed)
No infection or gout seen.  Will you let patient know

## 2019-03-08 ENCOUNTER — Other Ambulatory Visit: Payer: Self-pay | Admitting: Podiatry

## 2019-03-08 ENCOUNTER — Ambulatory Visit (INDEPENDENT_AMBULATORY_CARE_PROVIDER_SITE_OTHER): Payer: BC Managed Care – PPO

## 2019-03-08 ENCOUNTER — Other Ambulatory Visit: Payer: Self-pay

## 2019-03-08 ENCOUNTER — Encounter: Payer: Self-pay | Admitting: Podiatry

## 2019-03-08 ENCOUNTER — Ambulatory Visit (INDEPENDENT_AMBULATORY_CARE_PROVIDER_SITE_OTHER): Payer: BC Managed Care – PPO | Admitting: Podiatry

## 2019-03-08 DIAGNOSIS — M79671 Pain in right foot: Secondary | ICD-10-CM | POA: Diagnosis not present

## 2019-03-08 DIAGNOSIS — M7671 Peroneal tendinitis, right leg: Secondary | ICD-10-CM

## 2019-03-08 MED ORDER — DICLOFENAC SODIUM 75 MG PO TBEC: 75.0000 mg | DELAYED_RELEASE_TABLET | Freq: Two times a day (BID) | ORAL | 2 refills | Status: DC

## 2019-03-11 NOTE — Progress Notes (Signed)
Subjective:   Patient ID: Carolyn Gilmore, female   DOB: 38 y.o.   MRN: [id number]   HPI Patient presents stating she is developed a lot of pain on the outside of her right foot and she does not remember specific injury but states it is been going on for several months does not smoke likes to be active   Review of Systems  All other systems reviewed and are negative.       Objective:  Physical Exam Vitals signs and nursing note reviewed.  Constitutional:      Appearance: She is well-developed.  Pulmonary:     Effort: Pulmonary effort is normal.  Musculoskeletal: Normal range of motion.  Skin:    General: Skin is warm.  Neurological:     Mental Status: She is alert.     Neurovascular status intact muscle strength found to be adequate range of motion within normal limits with patient found to have exquisite discomfort lateral side right foot that is very painful when pressed and makes walking and shoe gear difficult.  Patient is found to have good digital perfusion and is well oriented x3     Assessment:  Acute peroneal tendinitis right with inflammation fluid of the insertion     Plan:  H&P conditions reviewed and recommended conservative treatment.  Careful steroid injection administered right lateral foot at the insertion base 3 mg Dexasone Kenalog 5 mg Xylocaine and applied fascial brace to lift up the lateral side of the foot along with ice  X-ray was negative for signs of fracture appears to be soft tissue injury

## 2019-03-29 ENCOUNTER — Other Ambulatory Visit: Payer: Self-pay

## 2019-03-29 ENCOUNTER — Ambulatory Visit (INDEPENDENT_AMBULATORY_CARE_PROVIDER_SITE_OTHER): Payer: BC Managed Care – PPO | Admitting: Podiatry

## 2019-03-29 ENCOUNTER — Encounter: Payer: Self-pay | Admitting: Podiatry

## 2019-03-29 DIAGNOSIS — M7671 Peroneal tendinitis, right leg: Secondary | ICD-10-CM | POA: Diagnosis not present

## 2019-03-29 NOTE — Progress Notes (Signed)
Subjective:   Patient ID: Carolyn Gilmore, female   DOB: 38 y.o.   MRN: [id number]   HPI Patient presents stating I am doing a lot better with minimal discomfort and unable to walk now without significant pain   ROS      Objective:  Physical Exam  Neurovascular status intact with significant diminishment of discomfort base of fifth metatarsal right with mild swelling still around the area     Assessment:  Peroneal tendinitis right improved but still 1 area of tenderness     Plan:  Advised on aggressive ice anti-inflammatories physical therapy and patient will be seen back to recheck again on an as-needed basis and may require orthotics that I educated her on today

## 2019-03-30 DIAGNOSIS — F331 Major depressive disorder, recurrent, moderate: Secondary | ICD-10-CM | POA: Diagnosis not present

## 2019-03-30 DIAGNOSIS — F411 Generalized anxiety disorder: Secondary | ICD-10-CM | POA: Diagnosis not present

## 2019-03-30 DIAGNOSIS — F902 Attention-deficit hyperactivity disorder, combined type: Secondary | ICD-10-CM | POA: Diagnosis not present

## 2019-03-30 DIAGNOSIS — F4323 Adjustment disorder with mixed anxiety and depressed mood: Secondary | ICD-10-CM | POA: Diagnosis not present

## 2019-04-02 DIAGNOSIS — F4323 Adjustment disorder with mixed anxiety and depressed mood: Secondary | ICD-10-CM | POA: Diagnosis not present

## 2019-04-02 DIAGNOSIS — F331 Major depressive disorder, recurrent, moderate: Secondary | ICD-10-CM | POA: Diagnosis not present

## 2019-04-02 DIAGNOSIS — F411 Generalized anxiety disorder: Secondary | ICD-10-CM | POA: Diagnosis not present

## 2019-04-05 DIAGNOSIS — F902 Attention-deficit hyperactivity disorder, combined type: Secondary | ICD-10-CM | POA: Diagnosis not present

## 2019-04-05 DIAGNOSIS — F331 Major depressive disorder, recurrent, moderate: Secondary | ICD-10-CM | POA: Diagnosis not present

## 2019-04-05 DIAGNOSIS — F411 Generalized anxiety disorder: Secondary | ICD-10-CM | POA: Diagnosis not present

## 2019-04-05 DIAGNOSIS — F4323 Adjustment disorder with mixed anxiety and depressed mood: Secondary | ICD-10-CM | POA: Diagnosis not present

## 2019-04-19 DIAGNOSIS — F411 Generalized anxiety disorder: Secondary | ICD-10-CM | POA: Diagnosis not present

## 2019-04-19 DIAGNOSIS — F4323 Adjustment disorder with mixed anxiety and depressed mood: Secondary | ICD-10-CM | POA: Diagnosis not present

## 2019-04-19 DIAGNOSIS — F331 Major depressive disorder, recurrent, moderate: Secondary | ICD-10-CM | POA: Diagnosis not present

## 2019-05-03 DIAGNOSIS — F411 Generalized anxiety disorder: Secondary | ICD-10-CM | POA: Diagnosis not present

## 2019-05-03 DIAGNOSIS — F339 Major depressive disorder, recurrent, unspecified: Secondary | ICD-10-CM | POA: Diagnosis not present

## 2019-05-03 DIAGNOSIS — F410 Panic disorder [episodic paroxysmal anxiety] without agoraphobia: Secondary | ICD-10-CM | POA: Diagnosis not present

## 2019-05-17 DIAGNOSIS — F331 Major depressive disorder, recurrent, moderate: Secondary | ICD-10-CM | POA: Diagnosis not present

## 2019-05-17 DIAGNOSIS — F902 Attention-deficit hyperactivity disorder, combined type: Secondary | ICD-10-CM | POA: Diagnosis not present

## 2019-05-17 DIAGNOSIS — F411 Generalized anxiety disorder: Secondary | ICD-10-CM | POA: Diagnosis not present

## 2019-05-17 DIAGNOSIS — F4323 Adjustment disorder with mixed anxiety and depressed mood: Secondary | ICD-10-CM | POA: Diagnosis not present

## 2019-06-13 DIAGNOSIS — F331 Major depressive disorder, recurrent, moderate: Secondary | ICD-10-CM | POA: Diagnosis not present

## 2019-06-13 DIAGNOSIS — F411 Generalized anxiety disorder: Secondary | ICD-10-CM | POA: Diagnosis not present

## 2019-06-13 DIAGNOSIS — F4323 Adjustment disorder with mixed anxiety and depressed mood: Secondary | ICD-10-CM | POA: Diagnosis not present

## 2019-06-13 DIAGNOSIS — F902 Attention-deficit hyperactivity disorder, combined type: Secondary | ICD-10-CM | POA: Diagnosis not present

## 2019-09-21 ENCOUNTER — Ambulatory Visit: Payer: BC Managed Care – PPO | Attending: Internal Medicine

## 2019-09-21 ENCOUNTER — Other Ambulatory Visit: Payer: Self-pay

## 2019-09-21 DIAGNOSIS — Z20822 Contact with and (suspected) exposure to covid-19: Secondary | ICD-10-CM

## 2019-09-22 LAB — NOVEL CORONAVIRUS, NAA: SARS-CoV-2, NAA: NOT DETECTED

## 2019-09-22 LAB — SARS-COV-2, NAA 2 DAY TAT

## 2019-11-20 ENCOUNTER — Ambulatory Visit: Payer: BC Managed Care – PPO | Attending: Internal Medicine

## 2019-11-20 DIAGNOSIS — Z20822 Contact with and (suspected) exposure to covid-19: Secondary | ICD-10-CM

## 2019-11-21 LAB — NOVEL CORONAVIRUS, NAA: SARS-CoV-2, NAA: NOT DETECTED

## 2019-11-21 LAB — SARS-COV-2, NAA 2 DAY TAT

## 2020-01-04 DIAGNOSIS — F902 Attention-deficit hyperactivity disorder, combined type: Secondary | ICD-10-CM | POA: Diagnosis not present

## 2020-01-04 DIAGNOSIS — F411 Generalized anxiety disorder: Secondary | ICD-10-CM | POA: Diagnosis not present

## 2020-01-04 DIAGNOSIS — F331 Major depressive disorder, recurrent, moderate: Secondary | ICD-10-CM | POA: Diagnosis not present

## 2020-01-04 DIAGNOSIS — F4323 Adjustment disorder with mixed anxiety and depressed mood: Secondary | ICD-10-CM | POA: Diagnosis not present

## 2020-02-09 ENCOUNTER — Encounter: Payer: BC Managed Care – PPO | Admitting: Physician Assistant

## 2020-02-14 ENCOUNTER — Encounter: Payer: Self-pay | Admitting: Physician Assistant

## 2020-02-14 ENCOUNTER — Other Ambulatory Visit: Payer: Self-pay

## 2020-02-14 ENCOUNTER — Ambulatory Visit (INDEPENDENT_AMBULATORY_CARE_PROVIDER_SITE_OTHER): Payer: Commercial Managed Care - PPO | Admitting: Physician Assistant

## 2020-02-14 VITALS — BP 124/88 | HR 85 | Temp 97.3°F | Ht 69.5 in | Wt 266.5 lb

## 2020-02-14 DIAGNOSIS — Z13220 Encounter for screening for lipoid disorders: Secondary | ICD-10-CM

## 2020-02-14 DIAGNOSIS — Z0001 Encounter for general adult medical examination with abnormal findings: Secondary | ICD-10-CM

## 2020-02-14 DIAGNOSIS — Z136 Encounter for screening for cardiovascular disorders: Secondary | ICD-10-CM

## 2020-02-14 DIAGNOSIS — E669 Obesity, unspecified: Secondary | ICD-10-CM | POA: Diagnosis not present

## 2020-02-14 DIAGNOSIS — Z1159 Encounter for screening for other viral diseases: Secondary | ICD-10-CM | POA: Diagnosis not present

## 2020-02-14 DIAGNOSIS — E8881 Metabolic syndrome: Secondary | ICD-10-CM

## 2020-02-14 LAB — TSH: TSH: 1.68 mIU/L

## 2020-02-14 NOTE — Progress Notes (Signed)
I acted as a Education administrator for [employer], PA-C [employer], LPN   Subjective:    Carolyn Gilmore is a 39 y.o. female and is here for a comprehensive physical exam.  HPI  Health Maintenance Due  Topic Date Due  . Hepatitis C Screening  Never done  . PAP SMEAR-Modifier  05/05/2019  . INFLUENZA VACCINE  01/28/2020    Acute Concerns:   Chronic Issues: Obesity -- consistently doing weight watchers. She has had weight gain with her ongoing stressful job that has prevented her from losing weight. She has done weight watchers in the past. Insulin resistance -- last HgbA1c is 5.9%. She has been on glyburide in the past.  Health Maintenance: Immunizations -- UTD Colonoscopy -- N/A Mammogram -- N/A PAP -- overdue, last done 2017 Normal / Neg HPV, scheduled with GYN. Bone Density -- N/A Diet -- does Weight Watchers occasionally Caffeine intake -- at times Sleep habits -- feels like she is sleeping too much due to rotating shifts Exercise -- trying to exercise as able Current Weight -- Weight: 266 lb 8 oz (120.9 kg)  Weight History: Wt Readings from Last 10 Encounters:  02/14/20 266 lb 8 oz (120.9 kg)  12/16/18 251 lb 4 oz (114 kg)  02/04/18 226 lb 6.4 oz (102.7 kg)  09/07/17 232 lb 12.8 oz (105.6 kg)  03/17/17 242 lb 4.8 oz (109.9 kg)  03/17/17 230 lb (104.3 kg)  03/05/17 242 lb 4 oz (109.9 kg)  02/01/17 234 lb 9.6 oz (106.4 kg)  12/03/16 231 lb (104.8 kg)   Body mass index is 38.79 kg/m. Mood -- stable with medications and couseling No LMP recorded. (Menstrual status: IUD).   Depression screen PHQ 2/9 02/14/2020  Decreased Interest 1  Down, Depressed, Hopeless 0  PHQ - 2 Score 1  Altered sleeping 3  Tired, decreased energy 3  Change in appetite 3  Feeling bad or failure about yourself  0  Trouble concentrating 1  Moving slowly or fidgety/restless 0  Suicidal thoughts 0  PHQ-9 Score 11  Difficult doing work/chores Somewhat difficult     Other  providers/specialists: Patient Care Team: Inda Coke, [place] as PCP - General (Physician Assistant)   PMHx, SurgHx, SocialHx, Medications, and Allergies were reviewed in the Visit Navigator and updated as appropriate.   Past Medical History:  Diagnosis Date  . Anxiety   . Depression   . Elevated liver enzymes 10/09/2016   RUQ ultrasound -- showed some gallstones  . Genital warts   . Gestational diabetes 2016  . HPV in female   . Hx of gallstones 10/2016  . Hypertension   . IBS (irritable bowel syndrome)   . Melanoma ([facility])    R thigh  . Melanoma of thigh, right ([facility]) 2016  . PCOS (polycystic ovarian syndrome)   . Vitamin B 12 deficiency 2015  . Vitamin D deficiency 2015     Past Surgical History:  Procedure Laterality Date  . CHOLECYSTECTOMY N/A 03/19/2017   Procedure: LAPAROSCOPIC CHOLECYSTECTOMY;  Surgeon: Clovis Riley, MD;  Location: [facility];  Service: General;  Laterality: N/A;  . MOLE REMOVAL N/A 2016   Right thigh, Melanoma  in office  . WISDOM TOOTH EXTRACTION      History reviewed. No pertinent family history.  Social History   Tobacco Use  . Smoking status: Never Smoker  . Smokeless tobacco: Never Used  Vaping Use  . Vaping Use: Never used  Substance Use Topics  . Alcohol use: Yes  Comment: 2-3 wines or beer on weekend  . Drug use: No    Review of Systems:   Review of Systems  Constitutional: Negative for chills, fever, malaise/fatigue and weight loss.  HENT: Negative for hearing loss, sinus pain and sore throat.   Eyes: Negative for blurred vision.  Respiratory: Negative for cough and shortness of breath.   Cardiovascular: Negative for chest pain, palpitations and leg swelling.  Gastrointestinal: Negative for abdominal pain, constipation, diarrhea, heartburn, nausea and vomiting.  Genitourinary: Negative for dysuria, frequency and urgency.  Musculoskeletal: Negative for back pain, myalgias and neck pain.  Skin: Negative for itching and  rash.  Neurological: Negative for dizziness, tingling, seizures, loss of consciousness and headaches.  Endo/Heme/Allergies: Negative for polydipsia.  Psychiatric/Behavioral: Negative for depression. The patient is nervous/anxious.        Sees a therapist    Objective:   BP 124/88 (BP Location: Left Arm, Patient Position: Sitting, Cuff Size: Large)   Pulse 85   Temp (!) 97.3 F (36.3 C) (Temporal)   Ht 5' 9.5" (1.765 m)   Wt 266 lb 8 oz (120.9 kg)   SpO2 97%   BMI 38.79 kg/m   General Appearance:    Alert, cooperative, no distress, appears stated age  Head:    Normocephalic, without obvious abnormality, atraumatic  Eyes:    PERRL, conjunctiva/corneas clear, EOM's intact, fundi    benign, both eyes  Ears:    Normal TM's and external ear canals, both ears  Nose:   Nares normal, septum midline, mucosa normal, no drainage    or sinus tenderness  Throat:   Lips, mucosa, and tongue normal; teeth and gums normal  Neck:   Supple, symmetrical, trachea midline, no adenopathy;    thyroid:  no enlargement/tenderness/nodules; no carotid   bruit or JVD  Back:     Symmetric, no curvature, ROM normal, no CVA tenderness  Lungs:     Clear to auscultation bilaterally, respirations unlabored  Chest Wall:    No tenderness or deformity   Heart:    Regular rate and rhythm, S1 and S2 normal, no murmur, rub   or gallop  Breast Exam:    Deferred  Abdomen:     Soft, non-tender, bowel sounds active all four quadrants,    no masses, no organomegaly  Genitalia:    Deferred  Rectal:    Deferred  Extremities:   Extremities normal, atraumatic, no cyanosis or edema  Pulses:   2+ and symmetric all extremities  Skin:   Skin color, texture, turgor normal, no rashes or lesions  Lymph nodes:   Cervical, supraclavicular, and axillary nodes normal  Neurologic:   CNII-XII intact, normal strength, sensation and reflexes    throughout     Assessment/Plan:   Arthur was seen today for annual exam.  Diagnoses and  all orders for this visit:  Encounter for general adult medical examination with abnormal findings Today patient counseled on age appropriate routine health concerns for screening and prevention, each reviewed and up to date or declined. Immunizations reviewed and up to date or declined. Labs ordered and reviewed. Risk factors for depression reviewed and negative. Hearing function and visual acuity are intact. ADLs screened and addressed as needed. Functional ability and level of safety reviewed and appropriate. Education, counseling and referrals performed based on assessed risks today. Patient provided with a copy of personalized plan for preventive services.  Encounter for lipid screening for cardiovascular disease -     Lipid panel; Future -  Lipid panel  Encounter for screening for other viral diseases -     Hepatitis C antibody; Future -     Hepatitis C antibody  Insulin resistance; Obesity, unspecified classification, unspecified obesity type, unspecified whether serious comorbidity present Update A1c; we are going to start either Ozempic vs Wegovy depending on A1c results. Encouraged continued healthy diet and exercise as able. -     Hemoglobin A1c; Future -     Hemoglobin A1c -     CBC with Differential/Platelet; Future -     Comprehensive metabolic panel; Future -     TSH; Future -     TSH -     Comprehensive metabolic panel -     CBC with Differential/Platelet    Well Adult Exam: Labs ordered: Yes. Patient counseling was done. See below for items discussed. Discussed the patient's BMI. The BMI is not in the acceptable range; BMI management plan is completed Follow up as needed for acute illness.  Patient Counseling:   [x]     Nutrition: Stressed importance of moderation in sodium/caffeine intake, saturated fat and cholesterol, caloric balance, sufficient intake of fresh fruits, vegetables, fiber, calcium, iron, and 1 mg of folate supplement per day (for females capable of  pregnancy).   [x]      Stressed the importance of regular exercise.    [x]     Substance Abuse: Discussed cessation/primary prevention of tobacco, alcohol, or other drug use; driving or other dangerous activities under the influence; availability of treatment for abuse.    [x]      Injury prevention: Discussed safety belts, safety helmets, smoke detector, smoking near bedding or upholstery.    [x]      Sexuality: Discussed sexually transmitted diseases, partner selection, use of condoms, avoidance of unintended pregnancy  and contraceptive alternatives.    [x]     Dental health: Discussed importance of regular tooth brushing, flossing, and dental visits.   [x]      Health maintenance and immunizations reviewed. Please refer to Health maintenance section.   CMA or LPN served as scribe during this visit. History, Physical, and Plan performed by medical provider. The above documentation has been reviewed and is accurate and complete.  Inda Coke, PA-C [facility]

## 2020-02-14 NOTE — Patient Instructions (Signed)
It was great to see you!  Please go to the lab for blood work.   I will save a Wegovy sample for you, and we will plan to start that or Ozempic based on your A1c results.  Our office will call you with your results unless you have chosen to receive results via MyChart.  If your blood work is normal we will follow-up each year for physicals and as scheduled for chronic medical problems.  If anything is abnormal we will treat accordingly and get you in for a follow-up.  Take care,  [facility] Maintenance, Female Adopting a healthy lifestyle and getting preventive care are important in promoting health and wellness. Ask your health care provider about:  The right schedule for you to have regular tests and exams.  Things you can do on your own to prevent diseases and keep yourself healthy. What should I know about diet, weight, and exercise? Eat a healthy diet   Eat a diet that includes plenty of vegetables, fruits, low-fat dairy products, and lean protein.  Do not eat a lot of foods that are high in solid fats, added sugars, or sodium. Maintain a healthy weight Body mass index (BMI) is used to identify weight problems. It estimates body fat based on height and weight. Your health care provider can help determine your BMI and help you achieve or maintain a healthy weight. Get regular exercise Get regular exercise. This is one of the most important things you can do for your health. Most adults should:  Exercise for at least 150 minutes each week. The exercise should increase your heart rate and make you sweat (moderate-intensity exercise).  Do strengthening exercises at least twice a week. This is in addition to the moderate-intensity exercise.  Spend less time sitting. Even light physical activity can be beneficial. Watch cholesterol and blood lipids Have your blood tested for lipids and cholesterol at 38 years of age, then have this test every 5 years. Have your  cholesterol levels checked more often if:  Your lipid or cholesterol levels are high.  You are older than 38 years of age.  You are at high risk for heart disease. What should I know about cancer screening? Depending on your health history and family history, you may need to have cancer screening at various ages. This may include screening for:  Breast cancer.  Cervical cancer.  Colorectal cancer.  Skin cancer.  Lung cancer. What should I know about heart disease, diabetes, and high blood pressure? Blood pressure and heart disease  High blood pressure causes heart disease and increases the risk of stroke. This is more likely to develop in people who have high blood pressure readings, are of African descent, or are overweight.  Have your blood pressure checked: ? Every 3-5 years if you are 64-40 years of age. ? Every year if you are 35 years old or older. Diabetes Have regular diabetes screenings. This checks your fasting blood sugar level. Have the screening done:  Once every three years after age 53 if you are at a normal weight and have a low risk for diabetes.  More often and at a younger age if you are overweight or have a high risk for diabetes. What should I know about preventing infection? Hepatitis B If you have a higher risk for hepatitis B, you should be screened for this virus. Talk with your health care provider to find out if you are at risk for hepatitis B infection.  Hepatitis C Testing is recommended for:  Everyone born from [birth year] through 1964.  Anyone with known risk factors for hepatitis C. Sexually transmitted infections (STIs)  Get screened for STIs, including gonorrhea and chlamydia, if: ? You are sexually active and are younger than 38 years of age. ? You are older than 38 years of age and your health care provider tells you that you are at risk for this type of infection. ? Your sexual activity has changed since you were last screened, and you are  at increased risk for chlamydia or gonorrhea. Ask your health care provider if you are at risk.  Ask your health care provider about whether you are at high risk for HIV. Your health care provider may recommend a prescription medicine to help prevent HIV infection. If you choose to take medicine to prevent HIV, you should first get tested for HIV. You should then be tested every 3 months for as long as you are taking the medicine. Pregnancy  If you are about to stop having your period (premenopausal) and you may become pregnant, seek counseling before you get pregnant.  Take 400 to 800 micrograms (mcg) of folic acid every day if you become pregnant.  Ask for birth control (contraception) if you want to prevent pregnancy. Osteoporosis and menopause Osteoporosis is a disease in which the bones lose minerals and strength with aging. This can result in bone fractures. If you are 67 years old or older, or if you are at risk for osteoporosis and fractures, ask your health care provider if you should:  Be screened for bone loss.  Take a calcium or vitamin D supplement to lower your risk of fractures.  Be given hormone replacement therapy (HRT) to treat symptoms of menopause. Follow these instructions at home: Lifestyle  Do not use any products that contain nicotine or tobacco, such as cigarettes, e-cigarettes, and chewing tobacco. If you need help quitting, ask your health care provider.  Do not use street drugs.  Do not share needles.  Ask your health care provider for help if you need support or information about quitting drugs. Alcohol use  Do not drink alcohol if: ? Your health care provider tells you not to drink. ? You are pregnant, may be pregnant, or are planning to become pregnant.  If you drink alcohol: ? Limit how much you use to 0-1 drink a day. ? Limit intake if you are breastfeeding.  Be aware of how much alcohol is in your drink. In the [place] one drink equals one 12 oz  bottle of beer (355 mL), one 5 oz glass of wine (148 mL), or one 1 oz glass of hard liquor (44 mL). General instructions  Schedule regular health, dental, and eye exams.  Stay current with your vaccines.  Tell your health care provider if: ? You often feel depressed. ? You have ever been abused or do not feel safe at home. Summary  Adopting a healthy lifestyle and getting preventive care are important in promoting health and wellness.  Follow your health care provider's instructions about healthy diet, exercising, and getting tested or screened for diseases.  Follow your health care provider's instructions on monitoring your cholesterol and blood pressure. This information is not intended to replace advice given to you by your health care provider. Make sure you discuss any questions you have with your health care provider. Document Revised: 06/08/2018 Document Reviewed: 06/08/2018 Elsevier Patient Education  2020 [employer].

## 2020-02-15 LAB — COMPREHENSIVE METABOLIC PANEL
AG Ratio: 1.8 (calc) (ref 1.0–2.5)
ALT: 32 U/L — ABNORMAL HIGH (ref 6–29)
AST: 19 U/L (ref 10–30)
Albumin: 4.4 g/dL (ref 3.6–5.1)
Alkaline phosphatase (APISO): 90 U/L (ref 31–125)
BUN: 10 mg/dL (ref 7–25)
CO2: 27 mmol/L (ref 20–32)
Calcium: 9.4 mg/dL (ref 8.6–10.2)
Chloride: 103 mmol/L (ref 98–110)
Creat: 0.64 mg/dL (ref 0.50–1.10)
Globulin: 2.4 g/dL (calc) (ref 1.9–3.7)
Glucose, Bld: 116 mg/dL — ABNORMAL HIGH (ref 65–99)
Potassium: 4.6 mmol/L (ref 3.5–5.3)
Sodium: 138 mmol/L (ref 135–146)
Total Bilirubin: 0.5 mg/dL (ref 0.2–1.2)
Total Protein: 6.8 g/dL (ref 6.1–8.1)

## 2020-02-15 LAB — CBC WITH DIFFERENTIAL/PLATELET
Absolute Monocytes: 511 cells/uL (ref 200–950)
Basophils Absolute: 43 cells/uL (ref 0–200)
Basophils Relative: 0.6 %
Eosinophils Absolute: 288 cells/uL (ref 15–500)
Eosinophils Relative: 4 %
HCT: 38.5 % (ref 35.0–45.0)
Hemoglobin: 12.8 g/dL (ref 11.7–15.5)
Lymphs Abs: 1411 cells/uL (ref 850–3900)
MCH: 28.6 pg (ref 27.0–33.0)
MCHC: 33.2 g/dL (ref 32.0–36.0)
MCV: 86.1 fL (ref 80.0–100.0)
MPV: 9 fL (ref 7.5–12.5)
Monocytes Relative: 7.1 %
Neutro Abs: 4946 cells/uL (ref 1500–7800)
Neutrophils Relative %: 68.7 %
Platelets: 323 10*3/uL (ref 140–400)
RBC: 4.47 10*6/uL (ref 3.80–5.10)
RDW: 12.1 % (ref 11.0–15.0)
Total Lymphocyte: 19.6 %
WBC: 7.2 10*3/uL (ref 3.8–10.8)

## 2020-02-15 LAB — LIPID PANEL
Cholesterol: 180 mg/dL (ref <200)
HDL: 50 mg/dL (ref >=50)
LDL Cholesterol (Calc): 115 mg/dL (calc) — ABNORMAL HIGH
Non-HDL Cholesterol (Calc): 130 mg/dL (calc) — ABNORMAL HIGH (ref <130)
Total CHOL/HDL Ratio: 3.6 (calc) (ref <5.0)
Triglycerides: 62 mg/dL (ref <150)

## 2020-02-15 LAB — HEMOGLOBIN A1C
Hgb A1c MFr Bld: 5.7 % of total Hgb — ABNORMAL HIGH (ref <5.7)
Mean Plasma Glucose: 117 (calc)
eAG (mmol/L): 6.5 (calc)

## 2020-02-15 LAB — HEPATITIS C ANTIBODY
Hepatitis C Ab: NONREACTIVE
SIGNAL TO CUT-OFF: 0.01 (ref <1.00)

## 2020-02-16 ENCOUNTER — Encounter: Payer: Self-pay | Admitting: Physician Assistant

## 2020-03-13 ENCOUNTER — Telehealth (INDEPENDENT_AMBULATORY_CARE_PROVIDER_SITE_OTHER): Payer: BC Managed Care – PPO | Admitting: Physician Assistant

## 2020-03-13 ENCOUNTER — Other Ambulatory Visit: Payer: Self-pay | Admitting: Physician Assistant

## 2020-03-13 ENCOUNTER — Encounter: Payer: Self-pay | Admitting: Physician Assistant

## 2020-03-13 VITALS — Ht 69.5 in | Wt 258.0 lb

## 2020-03-13 DIAGNOSIS — J0111 Acute recurrent frontal sinusitis: Secondary | ICD-10-CM | POA: Diagnosis not present

## 2020-03-13 MED ORDER — SEMAGLUTIDE-WEIGHT MANAGEMENT 0.5 MG/0.5ML ~~LOC~~ SOAJ: 0.5000 mg | SUBCUTANEOUS | 0 refills | Status: DC

## 2020-03-13 MED ORDER — SEMAGLUTIDE-WEIGHT MANAGEMENT 1 MG/0.5ML ~~LOC~~ SOAJ: 1.0000 mg | SUBCUTANEOUS | 0 refills | Status: DC

## 2020-03-13 MED ORDER — AMOXICILLIN-POT CLAVULANATE 875-125 MG PO TABS
1.0000 | ORAL_TABLET | Freq: Two times a day (BID) | ORAL | 0 refills | Status: DC
Start: 2020-03-13 — End: 2020-06-04

## 2020-03-13 NOTE — Progress Notes (Signed)
Virtual Visit via Video   I connected with Carolyn Gilmore on 03/13/20 at 12:30 PM EDT by a video enabled telemedicine application and verified that I am speaking with the correct person using two identifiers. Location patient: Home Location provider: [facility], Office Persons participating in the virtual visit: Mava Suares, Inda Coke PA-C, Anselmo Pickler, LPN   I discussed the limitations of evaluation and management by telemedicine and the availability of in person appointments. The patient expressed understanding and agreed to proceed.  I acted as a Education administrator for [employer], PA-C [employer], LPN    Subjective:   HPI:   URI symptoms Symptom onset: started on Sunday  Travel/contacts: Pt works at the [employer], tested this morning was Negative. Has had testing done 4 days in a row all negative.   Vaccination status: complete  Patient endorses the following symptoms: sinus congestion, sinus pain, rhinorrhea and productive cough (when she first gets up in the morning she will cough up yellow sputum)  Patient denies the following symptoms: Fever (none), itchy watery eyes, ear pain, sore throat, wheezing, shortness of breath, chest tightness and chest pain  Treatments tried: Tylenol sinus and congestion, Ibuprofen  Patient risk factors: Current [id number] risk of complications score: 1 Smoking status: Carolyn Gilmore  reports that she has never smoked. She has never used smokeless tobacco. If female, currently pregnant? []   Yes [x]   No  ROS: See pertinent positives and negatives per HPI.  Patient Active Problem List   Diagnosis Date Noted  . Effusion, left knee 12/22/2018  . Melanoma ([place])   . IBS (irritable bowel syndrome)   . Depression   . Anxiety   . Melanoma of thigh, right ([place]) 06/29/2014  . Vitamin D deficiency 06/29/2013  . Vitamin B 12 deficiency 06/29/2013    Social History   Tobacco Use  . Smoking status: Never Smoker  . Smokeless  tobacco: Never Used  Substance Use Topics  . Alcohol use: Yes    Comment: 2-3 wines or beer on weekend    Current Outpatient Medications:  .  Amphetamine Sulfate (EVEKEO) 10 MG TABS, Take 10 mg by mouth daily as needed. , Disp: , Rfl:  .  Ascorbic Acid (VITAMIN C) 1000 MG tablet, Vitamin C, Disp: , Rfl:  .  Calcium Carbonate (CALCIUM 500 PO), Take 1 tablet by mouth daily., Disp: , Rfl:  .  clonazePAM (KLONOPIN) 0.5 MG tablet, Take 0.125-0.25 mg by mouth daily as needed for anxiety (takes 1/4-1/2 tablet depending on anxiety level). , Disp: , Rfl:  .  Cyanocobalamin (VITAMIN B12 PO), Vitamin B12, Disp: , Rfl:  .  DULoxetine (CYMBALTA) 30 MG capsule, Take 90 mg by mouth daily., Disp: , Rfl:  .  DULoxetine (CYMBALTA) 60 MG capsule, Take 1 capsule (60 mg total) by mouth daily. (Patient taking differently: Take 60 mg by mouth every evening. Takes  30 mg and 60 mg to equal 90 mg), Disp: 90 capsule, Rfl: 0 .  Levonorgestrel (KYLEENA) 19.5 MG IUD, by Intrauterine route. Placed July 2018, needs to be removed 2023., Disp: , Rfl:  .  Multiple Vitamin (MULTIVITAMIN) tablet, Take 1 tablet by mouth daily., Disp: , Rfl:  .  Omega-3 Fatty Acids (FISH OIL) 1000 MG CAPS, Fish Oil, Disp: , Rfl:  .  propranolol (INDERAL) 10 MG tablet, Take 10 mg by mouth daily as needed., Disp: , Rfl:  .  Semaglutide-Weight Management (WEGOVY) 0.25 MG/0.5ML SOAJ, Inject 0.25 mg into the skin., Disp: , Rfl:  .  VITAMIN D, CHOLECALCIFEROL, PO, Take 1,000 Units by mouth daily., Disp: , Rfl:  .  amoxicillin-clavulanate (AUGMENTIN) 875-125 MG tablet, Take 1 tablet by mouth 2 (two) times daily., Disp: 20 tablet, Rfl: 0  Allergies  Allergen Reactions  . Bacitracin Other (See Comments)    Blisters and pustules    Objective:   VITALS: Per patient if applicable, see vitals. GENERAL: Alert, appears well and in no acute distress. HEENT: Atraumatic, conjunctiva clear, no obvious abnormalities on inspection of external nose and  ears. NECK: Normal movements of the head and neck. CARDIOPULMONARY: No increased WOB. Speaking in clear sentences. I:E ratio WNL.  MS: Moves all visible extremities without noticeable abnormality. PSYCH: Pleasant and cooperative, well-groomed. Speech normal rate and rhythm. Affect is appropriate. Insight and judgement are appropriate. Attention is focused, linear, and appropriate.  NEURO: CN grossly intact. Oriented as arrived to appointment on time with no prompting. Moves both UE equally.  SKIN: No obvious lesions, wounds, erythema, or cyanosis noted on face or hands.  Assessment and Plan:   Carolyn Gilmore was seen today for covid symptoms.  Diagnoses and all orders for this visit:  Acute recurrent frontal sinusitis  Other orders -     amoxicillin-clavulanate (AUGMENTIN) 875-125 MG tablet; Take 1 tablet by mouth 2 (two) times daily.    No red flags on discussion, patient is not in any obvious distress during our visit. Start oral Augmentin for sinusitis -- this has worked well for her in the past. Discussed over the counter supportive care options, with recommendations to push fluids and rest.  Reviewed return precautions including new/worsening fever, SOB, new/worsening cough or other concerns.  Recommended need to self-quarantine and practice social distancing until symptoms resolve. I recommend that patient follow-up if symptoms worsen or persist despite treatment x 7-10 days, sooner if needed.  I discussed the assessment and treatment plan with the patient. The patient was provided an opportunity to ask questions and all were answered. The patient agreed with the plan and demonstrated an understanding of the instructions.   The patient was advised to call back or seek an in-person evaluation if the symptoms worsen or if the condition fails to improve as anticipated.   CMA or LPN served as scribe during this visit. History, Physical, and Plan performed by medical provider. The above  documentation has been reviewed and is accurate and complete.   [place], [place] 03/13/2020

## 2020-03-25 ENCOUNTER — Encounter: Payer: Self-pay | Admitting: Physician Assistant

## 2020-03-25 MED FILL — WEGOVY 0.5 MG/0.5ML SOAJ: 0.5 | 28 days supply | Qty: 2 | Fill #0

## 2020-04-26 ENCOUNTER — Other Ambulatory Visit: Payer: Self-pay | Admitting: Physician Assistant

## 2020-04-26 ENCOUNTER — Encounter: Payer: Self-pay | Admitting: Physician Assistant

## 2020-04-26 MED ORDER — WEGOVY 1.7 MG/0.75ML ~~LOC~~ SOAJ: 1.7000 mg | SUBCUTANEOUS | 0 refills | Status: DC

## 2020-04-26 MED FILL — WEGOVY 1 MG/0.5ML SOAJ: 1 | 28 days supply | Qty: 2 | Fill #0

## 2020-05-06 ENCOUNTER — Ambulatory Visit: Payer: BC Managed Care – PPO | Attending: Internal Medicine

## 2020-05-06 DIAGNOSIS — Z23 Encounter for immunization: Secondary | ICD-10-CM

## 2020-05-06 NOTE — Progress Notes (Signed)
   Covid-19 Vaccination Clinic  Name:  Janiya Millirons    MRN: [id number] DOB: [birth year]  05/06/2020  Ms. Tissue was observed post Covid-19 immunization for 15 minutes without incident. She was provided with Vaccine Information Sheet and instruction to access the V-Safe system.   Ms. Hendrie was instructed to call 911 with any severe reactions post vaccine: Marland Kitchen Difficulty breathing  . Swelling of face and throat  . A fast heartbeat  . A bad rash all over body  . Dizziness and weakness

## 2020-06-04 ENCOUNTER — Other Ambulatory Visit: Payer: Self-pay | Admitting: Physician Assistant

## 2020-06-04 ENCOUNTER — Encounter: Payer: Self-pay | Admitting: Physician Assistant

## 2020-06-04 MED ORDER — SEMAGLUTIDE-WEIGHT MANAGEMENT 2.4 MG/0.75ML ~~LOC~~ SOAJ: 2.4000 mg | SUBCUTANEOUS | 3 refills | Status: DC

## 2020-06-04 MED FILL — WEGOVY 1.7 MG/0.75ML SOAJ: 1.7 | 28 days supply | Qty: 3 | Fill #0

## 2020-06-13 MED FILL — WEGOVY 2.4 MG/0.75ML SOAJ: 2.4 | 28 days supply | Qty: 3 | Fill #0

## 2020-06-25 MED FILL — WEGOVY 2.4 MG/0.75ML SOAJ: 2.4 | 28 days supply | Qty: 3 | Fill #0

## 2020-07-31 ENCOUNTER — Telehealth: Payer: Self-pay

## 2020-07-31 ENCOUNTER — Other Ambulatory Visit: Payer: Self-pay | Admitting: Physician Assistant

## 2020-07-31 MED ORDER — SEMAGLUTIDE-WEIGHT MANAGEMENT 2.4 MG/0.75ML ~~LOC~~ SOAJ: 2.4000 mg | SUBCUTANEOUS | 3 refills | Status: DC

## 2020-07-31 MED FILL — WEGOVY 2.4 MG/0.75ML SOAJ: 2.4 | 28 days supply | Qty: 3 | Fill #0

## 2020-07-31 NOTE — Telephone Encounter (Signed)
.   LAST APPOINTMENT DATE: 9/15//2021   NEXT APPOINTMENT DATE:03/14/21  MEDICATION: Semaglutide-Weight Management 2.4 MG/0.75ML SOAJ ( wegovy)   PHARMACY: [place], [place] FILLS OUT ALL BELOW:   LAST REFILL:  QTY:  REFILL DATE:    OTHER COMMENTS:    Okay for refill?  Please advise

## 2020-07-31 NOTE — Telephone Encounter (Signed)
Rx sent to pharmacy   

## 2020-08-29 DIAGNOSIS — F411 Generalized anxiety disorder: Secondary | ICD-10-CM | POA: Diagnosis not present

## 2020-08-29 DIAGNOSIS — F331 Major depressive disorder, recurrent, moderate: Secondary | ICD-10-CM | POA: Diagnosis not present

## 2020-08-29 DIAGNOSIS — F4323 Adjustment disorder with mixed anxiety and depressed mood: Secondary | ICD-10-CM | POA: Diagnosis not present

## 2020-08-29 DIAGNOSIS — F902 Attention-deficit hyperactivity disorder, combined type: Secondary | ICD-10-CM | POA: Diagnosis not present

## 2020-09-16 MED FILL — WEGOVY 2.4 MG/0.75ML SOAJ: 2.4 | 28 days supply | Qty: 3 | Fill #0

## 2020-10-08 ENCOUNTER — Other Ambulatory Visit (HOSPITAL_COMMUNITY): Payer: Self-pay

## 2020-10-08 MED FILL — Semaglutide (Weight Mngmt) Soln Auto-Injector 2.4 MG/0.75ML: SUBCUTANEOUS | 28 days supply | Qty: 3 | Fill #0 | Status: CN

## 2020-10-09 ENCOUNTER — Other Ambulatory Visit (HOSPITAL_COMMUNITY): Payer: Self-pay

## 2020-10-10 ENCOUNTER — Encounter: Payer: Self-pay | Admitting: Physician Assistant

## 2020-10-10 ENCOUNTER — Other Ambulatory Visit (HOSPITAL_COMMUNITY): Payer: Self-pay

## 2020-10-10 DIAGNOSIS — F331 Major depressive disorder, recurrent, moderate: Secondary | ICD-10-CM | POA: Diagnosis not present

## 2020-10-10 DIAGNOSIS — F902 Attention-deficit hyperactivity disorder, combined type: Secondary | ICD-10-CM | POA: Diagnosis not present

## 2020-10-10 DIAGNOSIS — F411 Generalized anxiety disorder: Secondary | ICD-10-CM | POA: Diagnosis not present

## 2020-10-10 DIAGNOSIS — F4323 Adjustment disorder with mixed anxiety and depressed mood: Secondary | ICD-10-CM | POA: Diagnosis not present

## 2020-10-10 MED FILL — Semaglutide (Weight Mngmt) Soln Auto-Injector 2.4 MG/0.75ML: SUBCUTANEOUS | 30 days supply | Qty: 3 | Fill #0 | Status: CN

## 2020-10-14 NOTE — Telephone Encounter (Signed)
Received fax from pharmacy need PA for [facility]. Started through [employer]: Key: [id number]   Left message on voicemail to call office. Need to ask pt few questions.

## 2020-10-16 NOTE — Telephone Encounter (Signed)
Finished PA through [employer] waiting on response.

## 2020-10-17 ENCOUNTER — Encounter: Payer: Self-pay | Admitting: *Deleted

## 2020-10-17 NOTE — Telephone Encounter (Signed)
Received fax from [facility] has been denied and Appeal has been started, form filled out and faxed over to [employer] at [phone].

## 2020-10-23 ENCOUNTER — Telehealth: Payer: Self-pay | Admitting: *Deleted

## 2020-10-23 NOTE — Telephone Encounter (Signed)
See My chart message

## 2020-10-23 NOTE — Telephone Encounter (Signed)
Pt called back, told her unfortunately I was unable to get Wegovy covered by your insurance. Denied due to not a covered medication. Pt verbalized understanding.

## 2020-10-23 NOTE — Telephone Encounter (Signed)
Left message on voicemail to call office.  

## 2020-10-23 NOTE — Telephone Encounter (Signed)
Called [employer] and spoke to [facility] regarding appeal sent on 4/21. Told her I received a fax on 4/21 saying [employer] does not handle this review and was cancelled. Gie said PA was denied due to not covered by patients insurance. Told her okay.

## 2020-11-13 ENCOUNTER — Other Ambulatory Visit (HOSPITAL_COMMUNITY): Payer: Self-pay

## 2020-11-13 ENCOUNTER — Other Ambulatory Visit: Payer: Self-pay | Admitting: Adult Health

## 2020-11-13 DIAGNOSIS — U071 COVID-19: Secondary | ICD-10-CM

## 2020-11-13 MED ORDER — MOLNUPIRAVIR EUA 200MG CAPSULE
4.0000 | ORAL_CAPSULE | Freq: Two times a day (BID) | ORAL | 0 refills | Status: AC
  Filled 2020-11-13: qty 40, 5d supply, fill #0

## 2020-11-13 NOTE — Progress Notes (Signed)
Outpatient Oral COVID Treatment Note  I connected with Carolyn Gilmore on 11/13/2020/11:00 AM by telephone and verified that I am speaking with the correct person using two identifiers.  I discussed the limitations, risks, security, and privacy concerns of performing an evaluation and management service by telephone and the availability of in person appointments. I also discussed with the patient that there may be a patient responsible charge related to this service. The patient expressed understanding and agreed to proceed.  Patient location: home Provider location: home  Diagnosis: COVID-19 infection  Purpose of visit: Discussion of potential use of Molnupiravir or Paxlovid, a new treatment for mild to moderate COVID-19 viral infection in non-hospitalized patients.   Subjective: Patient is a 40 y.o. female who has been diagnosed with COVID 19 viral infection.  Their symptoms began on 11/11/2020 with fatigue and a headache.    Past Medical History:  Diagnosis Date  . Anxiety   . Depression   . Elevated liver enzymes 10/09/2016   RUQ ultrasound -- showed some gallstones  . Genital warts   . Gestational diabetes 2016  . HPV in female   . Hx of gallstones 10/2016  . Hypertension   . IBS (irritable bowel syndrome)   . Melanoma ([place])    R thigh  . Melanoma of thigh, right ([place]) 2016  . PCOS (polycystic ovarian syndrome)   . Vitamin B 12 deficiency 2015  . Vitamin D deficiency 2015    Allergies  Allergen Reactions  . Bacitracin Other (See Comments)    Blisters and pustules     Current Outpatient Medications:  .  molnupiravir EUA 200 mg CAPS, Take 4 capsules (800 mg total) by mouth 2 (two) times daily for 5 days., Disp: 40 capsule, Rfl: 0 .  Amphetamine Sulfate (EVEKEO) 10 MG TABS, Take 10 mg by mouth daily as needed. , Disp: , Rfl:  .  Ascorbic Acid (VITAMIN C) 1000 MG tablet, Vitamin C, Disp: , Rfl:  .  Calcium Carbonate (CALCIUM 500 PO), Take 1 tablet by mouth daily., Disp: , Rfl:   .  clonazePAM (KLONOPIN) 0.5 MG tablet, Take 0.125-0.25 mg by mouth daily as needed for anxiety (takes 1/4-1/2 tablet depending on anxiety level). , Disp: , Rfl:  .  Cyanocobalamin (VITAMIN B12 PO), Vitamin B12, Disp: , Rfl:  .  DULoxetine (CYMBALTA) 30 MG capsule, Take 90 mg by mouth daily., Disp: , Rfl:  .  DULoxetine (CYMBALTA) 60 MG capsule, Take 1 capsule (60 mg total) by mouth daily. (Patient taking differently: Take 60 mg by mouth every evening. Takes  30 mg and 60 mg to equal 90 mg), Disp: 90 capsule, Rfl: 0 .  Levonorgestrel (KYLEENA) 19.5 MG IUD, by Intrauterine route. Placed July 2018, needs to be removed 2023., Disp: , Rfl:  .  Multiple Vitamin (MULTIVITAMIN) tablet, Take 1 tablet by mouth daily., Disp: , Rfl:  .  Omega-3 Fatty Acids (FISH OIL) 1000 MG CAPS, Fish Oil, Disp: , Rfl:  .  propranolol (INDERAL) 10 MG tablet, Take 10 mg by mouth daily as needed., Disp: , Rfl:  .  Semaglutide-Weight Management 2.4 MG/0.75ML SOAJ, INJECT 2.4 MG INTO THE SKIN ONCE A WEEK., Disp: 3 mL, Rfl: 2 .  VITAMIN D, CHOLECALCIFEROL, PO, Take 1,000 Units by mouth daily., Disp: , Rfl:   Objective: Patient appears/sounds moderately well.  They are in no apparent distress.  Breathing is non labored.  Mood and behavior are normal.  Laboratory Data:  No results found for this or any  previous visit (from the past 2160 hour(s)).   Assessment: 40 y.o. female with mild/moderate COVID 19 viral infection diagnosed on 11/13/2020 at high risk for progression to severe COVID 19.  Plan:  This patient is a 40 y.o. female that meets the following criteria for Emergency Use Authorization of: Molnupiravir  1. Age >18 yr 2. SARS-COV-2 positive test 3. Symptom onset < 5 days 4. Mild-to-moderate COVID disease with high risk for severe progression to hospitalization or death   I have spoken and communicated the following to the patient or parent/caregiver regarding: 1. Molnupiravir is an unapproved drug that is  authorized for use under an [employer] production planner.  2. There are no adequate, approved, available products for the treatment of COVID-19 in adults who have mild-to-moderate COVID-19 and are at high risk for progressing to severe COVID-19, including hospitalization or death. 3. Other therapeutics are currently authorized. For additional information on all products authorized for treatment or prevention of COVID-19, please see [employer].  4. There are benefits and risks of taking this treatment as outlined in the "Fact Sheet for Patients and Caregivers."  5. "Fact Sheet for Patients and Caregivers" was reviewed with patient. A hard copy will be provided to patient from pharmacy prior to the patient receiving treatment. 6. Patients should continue to self-isolate and use infection control measures (e.g., wear mask, isolate, social distance, avoid sharing personal items, clean and disinfect "high touch" surfaces, and frequent handwashing) according to CDC guidelines.  7. The patient or parent/caregiver has the option to accept or refuse treatment. 8. [place] has established a pregnancy surveillance program. 9. Females of childbearing potential should use a reliable method of contraception correctly and consistently, as applicable, for the duration of treatment and for 4 days after the last dose of Molnupiravir. 55. Males of reproductive potential who are sexually active with females of childbearing potential should use a reliable method of contraception correctly and consistently during treatment and for at least 3 months after the last dose. 11. Pregnancy status and risk was assessed. Patient verbalized understanding of precautions.   After reviewing above information with the patient, the patient agrees to receive molnupiravir.  Follow up instructions:    . Take  prescription BID x 5 days as directed . Reach out to pharmacist for counseling on medication if desired . For concerns regarding further COVID symptoms please follow up with your PCP or urgent care . For urgent or life-threatening issues, seek care at your local emergency department  The patient was provided an opportunity to ask questions, and all were answered. The patient agreed with the plan and demonstrated an understanding of the instructions.   Script sent to [facility] and opted to pick up RX.  The patient was advised to call their PCP or seek an in-person evaluation if the symptoms worsen or if the condition fails to improve as anticipated.   I provided 10 minutes of non face-to-face telephone visit time during this encounter, and > 50% was spent counseling as documented under my assessment & plan.  Scot Dock, NP 11/13/2020 /11:00 AM

## 2020-11-27 DIAGNOSIS — F411 Generalized anxiety disorder: Secondary | ICD-10-CM | POA: Diagnosis not present

## 2020-11-27 DIAGNOSIS — F902 Attention-deficit hyperactivity disorder, combined type: Secondary | ICD-10-CM | POA: Diagnosis not present

## 2020-11-27 DIAGNOSIS — F4323 Adjustment disorder with mixed anxiety and depressed mood: Secondary | ICD-10-CM | POA: Diagnosis not present

## 2020-11-27 DIAGNOSIS — F331 Major depressive disorder, recurrent, moderate: Secondary | ICD-10-CM | POA: Diagnosis not present

## 2020-12-04 DIAGNOSIS — F331 Major depressive disorder, recurrent, moderate: Secondary | ICD-10-CM | POA: Diagnosis not present

## 2020-12-04 DIAGNOSIS — F411 Generalized anxiety disorder: Secondary | ICD-10-CM | POA: Diagnosis not present

## 2020-12-04 DIAGNOSIS — F902 Attention-deficit hyperactivity disorder, combined type: Secondary | ICD-10-CM | POA: Diagnosis not present

## 2020-12-04 DIAGNOSIS — F4323 Adjustment disorder with mixed anxiety and depressed mood: Secondary | ICD-10-CM | POA: Diagnosis not present

## 2020-12-06 ENCOUNTER — Other Ambulatory Visit (HOSPITAL_COMMUNITY): Payer: Self-pay

## 2020-12-06 MED ORDER — DULOXETINE HCL 60 MG PO CPEP
60.0000 mg | ORAL_CAPSULE | Freq: Every day | ORAL | 1 refills | Status: DC
  Filled 2020-12-06: qty 30, 30d supply, fill #0

## 2020-12-06 MED ORDER — DULOXETINE HCL 30 MG PO CPEP
30.0000 mg | ORAL_CAPSULE | Freq: Every day | ORAL | 1 refills | Status: DC
  Filled 2020-12-06: qty 30, 30d supply, fill #0
  Filled 2021-01-06: qty 30, 30d supply, fill #1

## 2020-12-07 ENCOUNTER — Other Ambulatory Visit (HOSPITAL_COMMUNITY): Payer: Self-pay

## 2020-12-07 MED ORDER — PROPRANOLOL HCL 10 MG PO TABS
ORAL_TABLET | ORAL | 0 refills | Status: DC
  Filled 2021-04-15: qty 30, 30d supply, fill #0

## 2020-12-13 ENCOUNTER — Other Ambulatory Visit (HOSPITAL_COMMUNITY): Payer: Self-pay

## 2020-12-20 ENCOUNTER — Telehealth (INDEPENDENT_AMBULATORY_CARE_PROVIDER_SITE_OTHER): Payer: Commercial Managed Care - PPO | Admitting: Physician Assistant

## 2020-12-20 ENCOUNTER — Encounter: Payer: Self-pay | Admitting: Physician Assistant

## 2020-12-20 ENCOUNTER — Other Ambulatory Visit (HOSPITAL_COMMUNITY): Payer: Self-pay

## 2020-12-20 DIAGNOSIS — F419 Anxiety disorder, unspecified: Secondary | ICD-10-CM | POA: Diagnosis not present

## 2020-12-20 MED ORDER — BUSPIRONE HCL 10 MG PO TABS
ORAL_TABLET | ORAL | 0 refills | Status: DC
  Filled 2020-12-20: qty 60, 30d supply, fill #0

## 2020-12-20 MED ORDER — BUSPIRONE HCL 10 MG PO TABS
ORAL_TABLET | ORAL | 1 refills | Status: DC
  Filled 2020-12-20 – 2021-01-21 (×2): qty 60, 30d supply, fill #0

## 2020-12-20 MED ORDER — HYDROXYZINE HCL 10 MG PO TABS
ORAL_TABLET | ORAL | 1 refills | Status: DC
  Filled 2020-12-20: qty 120, 30d supply, fill #0
  Filled 2021-04-15: qty 120, 30d supply, fill #1

## 2020-12-20 NOTE — Progress Notes (Signed)
Virtual Visit via Video Note  I connected with Carolyn Gilmore on 12/20/20 at  9:00 AM EDT by a video enabled telemedicine application and verified that I am speaking with the correct person using two identifiers.  Location: Patient: Work  Provider: [employer] Persons present: Myself and patient   I discussed the limitations of evaluation and management by telemedicine and the availability of in person appointments. The patient expressed understanding and agreed to proceed.   History of Present Illness: Pleasant pt presents via video visit today for refill on buspirone.  She states that she sees psychiatry and her provider is out of the country for the next month until her next appointment with her.  She is stable on her medications.  States that she has been diagnosed with anxiety and depression for almost the last 20 years.  She has been stable on her Cymbalta and the BuSpar has recently just been added, but seems to help with panic attacks.  She also has a prescription for Klonopin which she takes very rarely, stating she takes 1 maybe every 6 months.  She also has a therapist that she connects with daily.    Observations/Objective:   Gen: Awake, alert, no acute distress Resp: Breathing is even and non-labored Psych: calm/pleasant demeanor Neuro: Alert and Oriented x 3, + facial symmetry, speech is clear.   Assessment and Plan: 1. Anxiety Doing well with buspirone and she will follow-up with her psychiatrist next month.  Sent refill on this medication today for her to take 5 mg in the morning and lunch, and then 10 mg at bedtime.  She will call if any other concerns arise before her psychiatrist comes back to town.   Follow Up Instructions:    I discussed the assessment and treatment plan with the patient. The patient was provided an opportunity to ask questions and all were answered. The patient agreed with the plan and demonstrated an understanding of the instructions.   The  patient was advised to call back or seek an in-person evaluation if the symptoms worsen or if the condition fails to improve as anticipated.  Nash Bolls M Pamila Mendibles, PA-C

## 2021-01-01 ENCOUNTER — Other Ambulatory Visit (HOSPITAL_COMMUNITY): Payer: Self-pay

## 2021-01-01 DIAGNOSIS — F331 Major depressive disorder, recurrent, moderate: Secondary | ICD-10-CM | POA: Diagnosis not present

## 2021-01-01 DIAGNOSIS — F4323 Adjustment disorder with mixed anxiety and depressed mood: Secondary | ICD-10-CM | POA: Diagnosis not present

## 2021-01-01 DIAGNOSIS — F411 Generalized anxiety disorder: Secondary | ICD-10-CM | POA: Diagnosis not present

## 2021-01-01 MED ORDER — DULOXETINE HCL 60 MG PO CPEP
ORAL_CAPSULE | ORAL | 2 refills | Status: DC
  Filled 2021-01-01: qty 30, 30d supply, fill #0

## 2021-01-06 ENCOUNTER — Other Ambulatory Visit (HOSPITAL_COMMUNITY): Payer: Self-pay

## 2021-01-07 DIAGNOSIS — R5383 Other fatigue: Secondary | ICD-10-CM | POA: Diagnosis not present

## 2021-01-07 DIAGNOSIS — E559 Vitamin D deficiency, unspecified: Secondary | ICD-10-CM | POA: Diagnosis not present

## 2021-01-07 DIAGNOSIS — F332 Major depressive disorder, recurrent severe without psychotic features: Secondary | ICD-10-CM | POA: Diagnosis not present

## 2021-01-07 DIAGNOSIS — F411 Generalized anxiety disorder: Secondary | ICD-10-CM | POA: Diagnosis not present

## 2021-01-21 ENCOUNTER — Other Ambulatory Visit (HOSPITAL_COMMUNITY): Payer: Self-pay

## 2021-01-30 ENCOUNTER — Other Ambulatory Visit (HOSPITAL_COMMUNITY): Payer: Self-pay

## 2021-01-30 DIAGNOSIS — F4323 Adjustment disorder with mixed anxiety and depressed mood: Secondary | ICD-10-CM | POA: Diagnosis not present

## 2021-01-30 DIAGNOSIS — F411 Generalized anxiety disorder: Secondary | ICD-10-CM | POA: Diagnosis not present

## 2021-01-30 DIAGNOSIS — F331 Major depressive disorder, recurrent, moderate: Secondary | ICD-10-CM | POA: Diagnosis not present

## 2021-01-30 DIAGNOSIS — F902 Attention-deficit hyperactivity disorder, combined type: Secondary | ICD-10-CM | POA: Diagnosis not present

## 2021-01-30 MED ORDER — DULOXETINE HCL 30 MG PO CPEP
ORAL_CAPSULE | ORAL | 2 refills | Status: DC
  Filled 2021-01-30: qty 30, 30d supply, fill #0
  Filled 2021-03-11: qty 30, 30d supply, fill #1
  Filled 2021-04-15: qty 30, 30d supply, fill #2

## 2021-01-30 MED ORDER — DULOXETINE HCL 60 MG PO CPEP
ORAL_CAPSULE | ORAL | 2 refills | Status: DC
  Filled 2021-01-30: qty 30, 30d supply, fill #0
  Filled 2021-03-11: qty 30, 30d supply, fill #1
  Filled 2021-04-15: qty 30, 30d supply, fill #2

## 2021-01-30 MED ORDER — BUSPIRONE HCL 10 MG PO TABS
ORAL_TABLET | ORAL | 1 refills | Status: DC
  Filled 2021-01-30: qty 90, 30d supply, fill #0
  Filled 2021-03-11: qty 90, 30d supply, fill #1

## 2021-02-03 DIAGNOSIS — F410 Panic disorder [episodic paroxysmal anxiety] without agoraphobia: Secondary | ICD-10-CM | POA: Diagnosis not present

## 2021-02-03 DIAGNOSIS — F4320 Adjustment disorder, unspecified: Secondary | ICD-10-CM | POA: Diagnosis not present

## 2021-02-03 DIAGNOSIS — F411 Generalized anxiety disorder: Secondary | ICD-10-CM | POA: Diagnosis not present

## 2021-02-10 DIAGNOSIS — F410 Panic disorder [episodic paroxysmal anxiety] without agoraphobia: Secondary | ICD-10-CM | POA: Diagnosis not present

## 2021-02-10 DIAGNOSIS — F411 Generalized anxiety disorder: Secondary | ICD-10-CM | POA: Diagnosis not present

## 2021-02-14 ENCOUNTER — Encounter: Payer: BC Managed Care – PPO | Admitting: Physician Assistant

## 2021-02-14 ENCOUNTER — Other Ambulatory Visit (HOSPITAL_COMMUNITY): Payer: Self-pay

## 2021-03-11 ENCOUNTER — Other Ambulatory Visit (HOSPITAL_COMMUNITY): Payer: Self-pay

## 2021-03-14 ENCOUNTER — Ambulatory Visit (INDEPENDENT_AMBULATORY_CARE_PROVIDER_SITE_OTHER): Payer: 59 | Admitting: Physician Assistant

## 2021-03-14 ENCOUNTER — Encounter: Payer: Self-pay | Admitting: Physician Assistant

## 2021-03-14 ENCOUNTER — Other Ambulatory Visit: Payer: Self-pay

## 2021-03-14 ENCOUNTER — Other Ambulatory Visit (HOSPITAL_COMMUNITY): Payer: Self-pay

## 2021-03-14 VITALS — BP 114/80 | HR 88 | Temp 97.2°F | Ht 69.5 in | Wt 259.4 lb

## 2021-03-14 DIAGNOSIS — E8881 Metabolic syndrome: Secondary | ICD-10-CM

## 2021-03-14 DIAGNOSIS — Z136 Encounter for screening for cardiovascular disorders: Secondary | ICD-10-CM

## 2021-03-14 DIAGNOSIS — F32A Depression, unspecified: Secondary | ICD-10-CM | POA: Diagnosis not present

## 2021-03-14 DIAGNOSIS — Z0001 Encounter for general adult medical examination with abnormal findings: Secondary | ICD-10-CM | POA: Diagnosis not present

## 2021-03-14 DIAGNOSIS — E559 Vitamin D deficiency, unspecified: Secondary | ICD-10-CM | POA: Diagnosis not present

## 2021-03-14 DIAGNOSIS — Z13220 Encounter for screening for lipoid disorders: Secondary | ICD-10-CM

## 2021-03-14 DIAGNOSIS — E669 Obesity, unspecified: Secondary | ICD-10-CM | POA: Diagnosis not present

## 2021-03-14 DIAGNOSIS — F419 Anxiety disorder, unspecified: Secondary | ICD-10-CM

## 2021-03-14 LAB — COMPREHENSIVE METABOLIC PANEL
ALT: 43 U/L — ABNORMAL HIGH (ref 0–35)
AST: 19 U/L (ref 0–37)
Albumin: 4.3 g/dL (ref 3.5–5.2)
Alkaline Phosphatase: 85 U/L (ref 39–117)
BUN: 11 mg/dL (ref 6–23)
CO2: 27 mEq/L (ref 19–32)
Calcium: 9.6 mg/dL (ref 8.4–10.5)
Chloride: 103 mEq/L (ref 96–112)
Creatinine, Ser: 0.66 mg/dL (ref 0.40–1.20)
GFR: 110.8 mL/min (ref >60.00)
Glucose, Bld: 113 mg/dL — ABNORMAL HIGH (ref 70–99)
Potassium: 4.2 mEq/L (ref 3.5–5.1)
Sodium: 138 mEq/L (ref 135–145)
Total Bilirubin: 0.5 mg/dL (ref 0.2–1.2)
Total Protein: 7.2 g/dL (ref 6.0–8.3)

## 2021-03-14 LAB — CBC WITH DIFFERENTIAL/PLATELET
Basophils Absolute: 0 10*3/uL (ref 0.0–0.1)
Basophils Relative: 0.7 % (ref 0.0–3.0)
Eosinophils Absolute: 0.3 10*3/uL (ref 0.0–0.7)
Eosinophils Relative: 5.2 % — ABNORMAL HIGH (ref 0.0–5.0)
HCT: 43 % (ref 36.0–46.0)
Hemoglobin: 14.2 g/dL (ref 12.0–15.0)
Lymphocytes Relative: 23.8 % (ref 12.0–46.0)
Lymphs Abs: 1.5 10*3/uL (ref 0.7–4.0)
MCHC: 32.9 g/dL (ref 30.0–36.0)
MCV: 87 fl (ref 78.0–100.0)
Monocytes Absolute: 0.4 10*3/uL (ref 0.1–1.0)
Monocytes Relative: 6.5 % (ref 3.0–12.0)
Neutro Abs: 4.2 10*3/uL (ref 1.4–7.7)
Neutrophils Relative %: 63.8 % (ref 43.0–77.0)
Platelets: 316 10*3/uL (ref 150.0–400.0)
RBC: 4.94 Mil/uL (ref 3.87–5.11)
RDW: 12.6 % (ref 11.5–15.5)
WBC: 6.5 10*3/uL (ref 4.0–10.5)

## 2021-03-14 LAB — VITAMIN D 25 HYDROXY (VIT D DEFICIENCY, FRACTURES): VITD: 31.07 ng/mL (ref 30.00–100.00)

## 2021-03-14 LAB — LIPID PANEL
Cholesterol: 188 mg/dL (ref 0–200)
HDL: 60.9 mg/dL (ref >39.00)
LDL Cholesterol: 114 mg/dL — ABNORMAL HIGH (ref 0–99)
NonHDL: 127.24
Total CHOL/HDL Ratio: 3
Triglycerides: 67 mg/dL (ref 0.0–149.0)
VLDL: 13.4 mg/dL (ref 0.0–40.0)

## 2021-03-14 LAB — HEMOGLOBIN A1C: Hgb A1c MFr Bld: 6.2 % (ref 4.6–6.5)

## 2021-03-14 MED ORDER — SEMAGLUTIDE-WEIGHT MANAGEMENT 1 MG/0.5ML ~~LOC~~ SOAJ
1.0000 mg | SUBCUTANEOUS | 3 refills | Status: DC
  Filled 2021-03-14: qty 2, 28d supply, fill #0

## 2021-03-14 MED ORDER — SEMAGLUTIDE (1 MG/DOSE) 4 MG/3ML ~~LOC~~ SOPN
1.0000 mg | PEN_INJECTOR | SUBCUTANEOUS | 2 refills | Status: DC
  Filled 2021-03-14: qty 9, 84d supply, fill #0
  Filled 2021-03-14: qty 3, 28d supply, fill #0

## 2021-03-14 NOTE — Patient Instructions (Signed)
It was great to see you!  Restart Wegovy -- 1 mg weekly  Please go to the lab for blood work.   Our office will call you with your results unless you have chosen to receive results via MyChart.  If your blood work is normal we will follow-up each year for physicals and as scheduled for chronic medical problems.  If anything is abnormal we will treat accordingly and get you in for a follow-up.  Take care,  Aldona Bar

## 2021-03-14 NOTE — Addendum Note (Signed)
Addended by: Erlene Quan on: 03/14/2021 08:58 AM   Modules accepted: Orders

## 2021-03-14 NOTE — Progress Notes (Signed)
Carolyn Gilmore is a 40 y.o. female here for a physical.   History of Present Illness:   Chief Complaint  Patient presents with   Annual Exam    Fasting   HPI   Anxiety/Depression She is currently going to a psychiatrist and uses [facility]. She is having panic attacks 2-3 x a week. She has experienced these in the past but well controlled.   Her symptoms worsened in March and she called her psychiatrist and her dosage of Buspar increased. Back in March her husband lost his job, her kitchen was remodeled however it just recently was finished and has limited her ability to eating healthy.   Since this time she sleeps a lot and has had a lack of motivation.  She reports her symptoms are exacerbated when at home. She reports the klonopin 0.5 mg makes her drowsy so she hasn't taken this in 2 months.  Denies SI/HI.  Obesity She was taking the max dose of wegovy to help with weight loss. However it is no longer covered by her insurance. He weight gain is associated with her depression/anxiety. She is interested in restarting this medication to see if her insurance will cover it.   Vitamin D She is taking 1000 units daily to maintain her vitamin D levels.    Screenings Pap- normal HPV negative 05/04/16 due at this time, she is amenable to scheduling with her OBGYN   Vaccine  COVID-  most recent 05/06/20 she has received the bivalent COVID vaccines Tdap- 12/20/14 Flu - 03/07/21  Past Medical History:  Diagnosis Date   Anxiety    Depression    Elevated liver enzymes 10/09/2016   RUQ ultrasound -- showed some gallstones   Genital warts    Gestational diabetes 2016   HPV in female    Hx of gallstones 10/2016   Hypertension    IBS (irritable bowel syndrome)    Melanoma (HCC)    R thigh   Melanoma of thigh, right ([place]) 2016   PCOS (polycystic ovarian syndrome)    Vitamin B 12 deficiency 2015   Vitamin D deficiency 2015     Social History   Tobacco Use   Smoking status: Never    Smokeless tobacco: Never  Vaping Use   Vaping Use: Never used  Substance Use Topics   Alcohol use: Yes    Comment: 2-3 wines or beer on weekend   Drug use: No    Past Surgical History:  Procedure Laterality Date   CHOLECYSTECTOMY N/A 03/19/2017   Procedure: LAPAROSCOPIC CHOLECYSTECTOMY;  Surgeon: Clovis Riley, MD;  Location: [facility];  Service: General;  Laterality: N/A;   MOLE REMOVAL N/A 2016   Right thigh, Melanoma  in office   WISDOM TOOTH EXTRACTION      Family History  Problem Relation Age of Onset   Hypertension Mother    Atrial fibrillation Mother    Hypertension Father    Hyperlipidemia Father    Diabetes Brother     Allergies  Allergen Reactions   Bacitracin Other (See Comments)    Blisters and pustules    Current Medications:   Current Outpatient Medications:    Amphetamine Sulfate 10 MG TABS, Take 10 mg by mouth daily as needed. , Disp: , Rfl:    busPIRone (BUSPAR) 10 MG tablet, Take 1 tablet by mouth 3 times a day, Disp: 90 tablet, Rfl: 1   Calcium Carbonate (CALCIUM 500 PO), Take 1 tablet by mouth daily., Disp: , Rfl:  clonazePAM (KLONOPIN) 0.5 MG tablet, Take 0.5 mg by mouth daily as needed for anxiety (2 tablets daily)., Disp: , Rfl:    DULoxetine (CYMBALTA) 30 MG capsule, Take 1 capsule by mouth at 5pm, Disp: 30 capsule, Rfl: 2   DULoxetine (CYMBALTA) 60 MG capsule, Take 1 capsule by mouth daily in the morning, Disp: 30 capsule, Rfl: 2   hydrOXYzine (ATARAX/VISTARIL) 10 MG tablet, Take 2 tablets by mouth 2 times a day as needed for anxiety, Disp: 120 tablet, Rfl: 1   levonorgestrel (KYLEENA) 19.5 MG IUD, by Intrauterine route. Placed July 2018, needs to be removed 2023., Disp: , Rfl:    Multiple Vitamin (MULTIVITAMIN) tablet, Take 1 tablet by mouth daily., Disp: , Rfl:    propranolol (INDERAL) 10 MG tablet, Take one tablet by mouth daily as needed for anxiety., Disp: 30 tablet, Rfl: 0   Semaglutide-Weight Management 1 MG/0.5ML SOAJ, Inject 1 mg  into the skin once a week., Disp: 2 mL, Rfl: 3   VITAMIN D, CHOLECALCIFEROL, PO, Take 1,000 Units by mouth daily., Disp: , Rfl:    ondansetron (ZOFRAN) 4 MG tablet, Take 4 mg by mouth at bedtime., Disp: , Rfl:    Review of Systems:   ROS Negative unless otherwise specified per HPI.  Vitals:   Vitals:   03/14/21 0813  BP: 114/80  Pulse: 88  Temp: (!) 97.2 F (36.2 C)  TempSrc: Temporal  SpO2: 96%  Weight: 259 lb 6.1 oz (117.7 kg)  Height: 5' 9.5" (1.765 m)     Body mass index is 37.75 kg/m.  Physical Exam:   Physical Exam Vitals and nursing note reviewed.  Constitutional:      General: She is not in acute distress.    Appearance: Normal appearance. She is well-developed. She is not ill-appearing or toxic-appearing.  HENT:     Head: Normocephalic and atraumatic.     Right Ear: Tympanic membrane, ear canal and external ear normal. Tympanic membrane is not erythematous, retracted or bulging.     Left Ear: Tympanic membrane, ear canal and external ear normal. Tympanic membrane is not erythematous, retracted or bulging.     Nose: Nose normal.     Right Sinus: No maxillary sinus tenderness or frontal sinus tenderness.     Left Sinus: No maxillary sinus tenderness or frontal sinus tenderness.     Mouth/Throat:     Pharynx: Uvula midline.  Eyes:     Conjunctiva/sclera: Conjunctivae normal.     Pupils: Pupils are equal, round, and reactive to light.  Neck:     Trachea: Trachea normal.  Cardiovascular:     Rate and Rhythm: Normal rate and regular rhythm.     Heart sounds: Normal heart sounds.  Pulmonary:     Effort: Pulmonary effort is normal.     Breath sounds: Normal breath sounds.  Abdominal:     Tenderness: There is no abdominal tenderness.  Lymphadenopathy:     Cervical: No cervical adenopathy.  Skin:    General: Skin is warm and dry.  Neurological:     Mental Status: She is alert.     GCS: GCS eye subscore is 4. GCS verbal subscore is 5. GCS motor subscore is 6.      Cranial Nerves: No cranial nerve deficit.     Sensory: No sensory deficit.  Psychiatric:        Speech: Speech normal.        Behavior: Behavior normal. Behavior is cooperative.  Thought Content: Thought content normal.    Assessment and Plan:   1. Encounter for general adult medical examination with abnormal findings Today patient counseled on age appropriate routine health concerns for screening and prevention, each reviewed and up to date or declined. Immunizations reviewed and up to date or declined. Labs ordered and reviewed. Risk factors for depression reviewed and negative. Hearing function and visual acuity are intact. ADLs screened and addressed as needed. Functional ability and level of safety reviewed and appropriate. Education, counseling and referrals performed based on assessed risks today. Patient provided with a copy of personalized plan for preventive services.  2. Anxiety Uncontrolled, but improving Managed per psychiatry and psychology Denies any needs from our office today I discussed with patient that if they develop any SI, to tell someone immediately and seek medical attention.  3. Depression, unspecified depression type Uncontrolled, but improving Managed per psychiatry and psychology Denies any needs from our office today I discussed with patient that if they develop any SI, to tell someone immediately and seek medical attention.  4. Obesity, unspecified classification, unspecified obesity type, unspecified whether serious comorbidity present Anticipate that weight loss efforts will improve as she has her kitchen access back Restart Wegovy 1 mg weekly Follow-up in 3 months, sooner if concerns  5. Insulin resistance Update hemoglobin A1c Restart Wegovy 1 mg weekly Follow-up in 3 months, sooner if concerns  6. Vitamin D deficiency Update vitamin D level and provide recommendations accordingly  7. Encounter for lipid screening for cardiovascular  disease Update lipid panel and provide recommendations accordingly  I,Essence Turner,acting as a scribe for Inda Coke, PA.,have documented all relevant documentation on the behalf of Inda Coke, PA,as directed by  Inda Coke, PA while in the presence of Inda Coke, [place].  I, Inda Coke, [place], have reviewed all documentation for this visit. The documentation on 03/14/21 for the exam, diagnosis, procedures, and orders are all accurate and complete.  Inda Coke, PA-C

## 2021-03-19 DIAGNOSIS — F410 Panic disorder [episodic paroxysmal anxiety] without agoraphobia: Secondary | ICD-10-CM | POA: Diagnosis not present

## 2021-03-19 DIAGNOSIS — F411 Generalized anxiety disorder: Secondary | ICD-10-CM | POA: Diagnosis not present

## 2021-03-25 DIAGNOSIS — F411 Generalized anxiety disorder: Secondary | ICD-10-CM | POA: Diagnosis not present

## 2021-03-25 DIAGNOSIS — F4320 Adjustment disorder, unspecified: Secondary | ICD-10-CM | POA: Diagnosis not present

## 2021-03-25 DIAGNOSIS — F410 Panic disorder [episodic paroxysmal anxiety] without agoraphobia: Secondary | ICD-10-CM | POA: Diagnosis not present

## 2021-03-26 ENCOUNTER — Encounter: Payer: Self-pay | Admitting: Orthopaedic Surgery

## 2021-03-26 ENCOUNTER — Ambulatory Visit (INDEPENDENT_AMBULATORY_CARE_PROVIDER_SITE_OTHER): Payer: 59 | Admitting: Orthopaedic Surgery

## 2021-03-26 ENCOUNTER — Other Ambulatory Visit: Payer: Self-pay

## 2021-03-26 DIAGNOSIS — F4320 Adjustment disorder, unspecified: Secondary | ICD-10-CM | POA: Diagnosis not present

## 2021-03-26 DIAGNOSIS — M79644 Pain in right finger(s): Secondary | ICD-10-CM

## 2021-03-26 DIAGNOSIS — F410 Panic disorder [episodic paroxysmal anxiety] without agoraphobia: Secondary | ICD-10-CM | POA: Diagnosis not present

## 2021-03-26 DIAGNOSIS — M79641 Pain in right hand: Secondary | ICD-10-CM

## 2021-03-26 DIAGNOSIS — M65331 Trigger finger, right middle finger: Secondary | ICD-10-CM | POA: Diagnosis not present

## 2021-03-26 DIAGNOSIS — F411 Generalized anxiety disorder: Secondary | ICD-10-CM | POA: Diagnosis not present

## 2021-03-26 MED ORDER — LIDOCAINE HCL 1 % IJ SOLN
1.0000 mL | INTRAMUSCULAR | Status: AC | PRN
  Administered 2021-03-26: 1 mL

## 2021-03-26 MED ORDER — METHYLPREDNISOLONE ACETATE 40 MG/ML IJ SUSP
13.3300 mg | INTRAMUSCULAR | Status: AC | PRN
  Administered 2021-03-26: 13.33 mg

## 2021-03-26 MED ORDER — BUPIVACAINE HCL 0.25 % IJ SOLN
0.3300 mL | INTRAMUSCULAR | Status: AC | PRN
  Administered 2021-03-26: .33 mL

## 2021-03-26 NOTE — Progress Notes (Signed)
Office Visit Note   Patient: Carolyn Gilmore           Date of Birth: 09/17/1980           MRN: [id number] Visit Date: 03/26/2021              Requested by: Inda Coke, [place] [address] [place],  [place] [address] PCP: Inda Coke, [place]   Assessment & Plan: Visit Diagnoses:  1. Trigger finger, right middle finger     Plan: Impression is right long finger trigger finger.  Today, we discussed trigger finger injection in addition to night splinting for a week.  She is agreeable to this plan.  She will follow-up with [place] as needed.  Call with concerns or questions in the meantime.  Follow-Up Instructions: Return if symptoms worsen or fail to improve.   Orders:  Orders Placed This Encounter  Procedures   Hand/UE Inj: R long A1    No orders of the defined types were placed in this encounter.     Procedures: Hand/UE Inj: R long A1 for trigger finger on 03/26/2021 9:03 AM Indications: pain Details: 25 G needle Medications: 1 mL lidocaine 1 %; 0.33 mL bupivacaine 0.25 %; 13.33 mg methylPREDNISolone acetate 40 MG/ML     Clinical Data: No additional findings.   Subjective: Chief Complaint  Patient presents with   Right Hand - New Patient (Initial Visit)    HPI patient is a pleasant 40 year old right-hand-dominant female who comes in today with pain and triggering to the right long finger.  This began about 10 years ago.  Pain seems to be worse first thing in the morning at the end of the workday as well as when she is doing yard work.  No previous cortisone injection.  She is not a diabetic.  Review of Systems as detailed in HPI.  All others reviewed and are negative.   Objective: Vital Signs: There were no vitals taken for this visit.  Physical Exam well-developed well-nourished female in no acute distress.  Alert and oriented x3.  Ortho Exam right hand exam reveals moderate tenderness and a palpable nodule at the A1 pulley of the long finger.  She does have  reproducible triggering.  She is neurovascular intact distally.  Specialty Comments:  No specialty comments available.  Imaging: No new imaging   PMFS History: Patient Active Problem List   Diagnosis Date Noted   Effusion, left knee 12/22/2018   Melanoma ([place])    IBS (irritable bowel syndrome)    Depression    Anxiety    Melanoma of thigh, right ([place]) 06/29/2014   Vitamin D deficiency 06/29/2013   Vitamin B 12 deficiency 06/29/2013   Past Medical History:  Diagnosis Date   Anxiety    Depression    Elevated liver enzymes 10/09/2016   RUQ ultrasound -- showed some gallstones   Genital warts    Gestational diabetes 2016   HPV in female    Hx of gallstones 10/2016   Hypertension    IBS (irritable bowel syndrome)    Melanoma (HCC)    R thigh   Melanoma of thigh, right ([place]) 2016   PCOS (polycystic ovarian syndrome)    Vitamin B 12 deficiency 2015   Vitamin D deficiency 2015    Family History  Problem Relation Age of Onset   Hypertension Mother    Atrial fibrillation Mother    Hypertension Father    Hyperlipidemia Father    Diabetes Brother  Past Surgical History:  Procedure Laterality Date   CHOLECYSTECTOMY N/A 03/19/2017   Procedure: LAPAROSCOPIC CHOLECYSTECTOMY;  Surgeon: Clovis Riley, MD;  Location: [facility];  Service: General;  Laterality: N/A;   MOLE REMOVAL N/A 2016   Right thigh, Melanoma  in office   WISDOM TOOTH EXTRACTION     Social History   Occupational History   Not on file  Tobacco Use   Smoking status: Never   Smokeless tobacco: Never  Vaping Use   Vaping Use: Never used  Substance and Sexual Activity   Alcohol use: Yes    Comment: 2-3 wines or beer on weekend   Drug use: No   Sexual activity: Yes    Birth control/protection: I.U.D.

## 2021-04-07 DIAGNOSIS — F411 Generalized anxiety disorder: Secondary | ICD-10-CM | POA: Diagnosis not present

## 2021-04-07 DIAGNOSIS — F410 Panic disorder [episodic paroxysmal anxiety] without agoraphobia: Secondary | ICD-10-CM | POA: Diagnosis not present

## 2021-04-07 DIAGNOSIS — F4320 Adjustment disorder, unspecified: Secondary | ICD-10-CM | POA: Diagnosis not present

## 2021-04-15 ENCOUNTER — Other Ambulatory Visit (HOSPITAL_COMMUNITY): Payer: Self-pay

## 2021-04-16 ENCOUNTER — Other Ambulatory Visit (HOSPITAL_COMMUNITY): Payer: Self-pay

## 2021-04-17 ENCOUNTER — Other Ambulatory Visit (HOSPITAL_COMMUNITY): Payer: Self-pay

## 2021-04-18 ENCOUNTER — Other Ambulatory Visit (HOSPITAL_COMMUNITY): Payer: Self-pay

## 2021-04-20 DIAGNOSIS — F411 Generalized anxiety disorder: Secondary | ICD-10-CM | POA: Diagnosis not present

## 2021-04-20 DIAGNOSIS — F4320 Adjustment disorder, unspecified: Secondary | ICD-10-CM | POA: Diagnosis not present

## 2021-04-20 DIAGNOSIS — F410 Panic disorder [episodic paroxysmal anxiety] without agoraphobia: Secondary | ICD-10-CM | POA: Diagnosis not present

## 2021-04-21 ENCOUNTER — Other Ambulatory Visit (HOSPITAL_COMMUNITY): Payer: Self-pay

## 2021-04-22 ENCOUNTER — Other Ambulatory Visit (HOSPITAL_COMMUNITY): Payer: Self-pay

## 2021-04-23 ENCOUNTER — Other Ambulatory Visit (HOSPITAL_COMMUNITY): Payer: Self-pay

## 2021-04-25 ENCOUNTER — Other Ambulatory Visit (HOSPITAL_COMMUNITY): Payer: Self-pay

## 2021-04-29 ENCOUNTER — Other Ambulatory Visit (HOSPITAL_COMMUNITY): Payer: Self-pay

## 2021-04-30 ENCOUNTER — Other Ambulatory Visit (HOSPITAL_COMMUNITY): Payer: Self-pay

## 2021-05-05 ENCOUNTER — Other Ambulatory Visit (HOSPITAL_COMMUNITY): Payer: Self-pay

## 2021-05-05 ENCOUNTER — Other Ambulatory Visit: Payer: Self-pay | Admitting: Physician Assistant

## 2021-05-05 DIAGNOSIS — F332 Major depressive disorder, recurrent severe without psychotic features: Secondary | ICD-10-CM | POA: Diagnosis not present

## 2021-05-05 DIAGNOSIS — F410 Panic disorder [episodic paroxysmal anxiety] without agoraphobia: Secondary | ICD-10-CM | POA: Diagnosis not present

## 2021-05-05 DIAGNOSIS — F411 Generalized anxiety disorder: Secondary | ICD-10-CM | POA: Diagnosis not present

## 2021-05-05 DIAGNOSIS — F4323 Adjustment disorder with mixed anxiety and depressed mood: Secondary | ICD-10-CM | POA: Diagnosis not present

## 2021-05-05 MED ORDER — BUSPIRONE HCL 15 MG PO TABS
ORAL_TABLET | ORAL | 0 refills | Status: DC
  Filled 2021-05-05: qty 90, 30d supply, fill #0

## 2021-05-06 ENCOUNTER — Other Ambulatory Visit (HOSPITAL_COMMUNITY): Payer: Self-pay

## 2021-05-07 ENCOUNTER — Other Ambulatory Visit (HOSPITAL_COMMUNITY): Payer: Self-pay

## 2021-05-07 MED ORDER — DULOXETINE HCL 60 MG PO CPEP
ORAL_CAPSULE | ORAL | 2 refills | Status: DC
  Filled 2021-05-07: qty 30, 30d supply, fill #0
  Filled 2021-06-12: qty 30, 30d supply, fill #1
  Filled 2021-07-24: qty 30, 30d supply, fill #2

## 2021-05-07 MED ORDER — HYDROXYZINE HCL 10 MG PO TABS
ORAL_TABLET | ORAL | 1 refills | Status: DC
  Filled 2021-05-07: qty 120, 30d supply, fill #0

## 2021-05-07 MED ORDER — DULOXETINE HCL 30 MG PO CPEP
ORAL_CAPSULE | ORAL | 2 refills | Status: DC
  Filled 2021-05-07: qty 30, 30d supply, fill #0
  Filled 2021-06-12: qty 30, 30d supply, fill #1
  Filled 2021-07-24: qty 30, 30d supply, fill #2

## 2021-05-09 ENCOUNTER — Other Ambulatory Visit (HOSPITAL_COMMUNITY): Payer: Self-pay

## 2021-05-12 ENCOUNTER — Encounter: Payer: Self-pay | Admitting: Physician Assistant

## 2021-05-12 NOTE — Telephone Encounter (Signed)
Please see message and advise 

## 2021-05-13 ENCOUNTER — Other Ambulatory Visit (HOSPITAL_COMMUNITY): Payer: Self-pay

## 2021-05-13 MED ORDER — AMPHETAMINE SULFATE 10 MG PO TABS
ORAL_TABLET | ORAL | 0 refills | Status: DC
  Filled 2021-05-13: qty 30, 30d supply, fill #0

## 2021-05-13 MED ORDER — OZEMPIC (0.25 OR 0.5 MG/DOSE) 2 MG/1.5ML ~~LOC~~ SOPN
0.2500 mg | PEN_INJECTOR | SUBCUTANEOUS | 1 refills | Status: DC
  Filled 2021-05-13: qty 1.5, 56d supply, fill #0
  Filled 2021-06-12: qty 1.5, 56d supply, fill #1

## 2021-05-20 ENCOUNTER — Other Ambulatory Visit (HOSPITAL_COMMUNITY): Payer: Self-pay

## 2021-05-27 ENCOUNTER — Other Ambulatory Visit (HOSPITAL_COMMUNITY): Payer: Self-pay

## 2021-05-27 ENCOUNTER — Other Ambulatory Visit: Payer: Self-pay

## 2021-05-27 ENCOUNTER — Encounter: Payer: Self-pay | Admitting: Orthopaedic Surgery

## 2021-05-27 ENCOUNTER — Ambulatory Visit (INDEPENDENT_AMBULATORY_CARE_PROVIDER_SITE_OTHER): Payer: 59 | Admitting: Orthopaedic Surgery

## 2021-05-27 ENCOUNTER — Ambulatory Visit (INDEPENDENT_AMBULATORY_CARE_PROVIDER_SITE_OTHER): Payer: 59

## 2021-05-27 DIAGNOSIS — G8929 Other chronic pain: Secondary | ICD-10-CM | POA: Diagnosis not present

## 2021-05-27 DIAGNOSIS — M65331 Trigger finger, right middle finger: Secondary | ICD-10-CM

## 2021-05-27 DIAGNOSIS — M25561 Pain in right knee: Secondary | ICD-10-CM | POA: Diagnosis not present

## 2021-05-27 MED ORDER — METHYLPREDNISOLONE ACETATE 40 MG/ML IJ SUSP
40.0000 mg | INTRAMUSCULAR | Status: AC | PRN
  Administered 2021-05-27: 40 mg via INTRA_ARTICULAR

## 2021-05-27 MED ORDER — LIDOCAINE HCL 1 % IJ SOLN
2.0000 mL | INTRAMUSCULAR | Status: AC | PRN
  Administered 2021-05-27: 2 mL

## 2021-05-27 MED ORDER — BUPIVACAINE HCL 0.25 % IJ SOLN
2.0000 mL | INTRAMUSCULAR | Status: AC | PRN
Start: 2021-05-27 — End: 2021-05-27
  Administered 2021-05-27: 2 mL via INTRA_ARTICULAR

## 2021-05-27 NOTE — Progress Notes (Signed)
Office Visit Note   Patient: Carolyn Gilmore           Date of Birth: Sep 09, 1980           MRN: [id number] Visit Date: 05/27/2021              Requested by: Inda Coke, [place] [address] [place],  [place] [address] PCP: Inda Coke, [place]   Assessment & Plan: Visit Diagnoses:  1. Chronic pain of right knee   2. Trigger finger, right middle finger     Plan: Impression is recurrent right long trigger finger and right knee questionable lateral meniscus tear.  In regards to the trigger finger, she had good, but temporary relief from the previous cortisone injection.  At this point, I recommended surgical intervention to include A1 pulley release.  She would like to think about this.  In regards to the right knee, we have discussed cortisone injection.  She is agreeable to this plan.  If she does not have good relief over the next several weeks she will call and let us know we will get an MRI to assess her meniscus.  Otherwise, follow-up with [place] as needed.  Follow-Up Instructions: Return if symptoms worsen or fail to improve.   Orders:  Orders Placed This Encounter  Procedures   Large Joint Inj: R knee   XR KNEE 3 VIEW RIGHT   No orders of the defined types were placed in this encounter.     Procedures: Large Joint Inj: R knee on 05/27/2021 9:29 AM Indications: pain Details: 22 G needle, anterolateral approach Medications: 2 mL lidocaine 1 %; 2 mL bupivacaine 0.25 %; 40 mg methylPREDNISolone acetate 40 MG/ML     Clinical Data: No additional findings.   Subjective: Chief Complaint  Patient presents with   Right Knee - Pain    HPI patient is a pleasant 40 year old female who comes in today with recurrent right long trigger finger and new onset right knee pain.  In regards to the trigger finger, she has had the symptoms for over 10 years.  She was seen in our office about 2 months ago for this where cortisone injection was performed.  This helps for about a week.  She  is only had 1 episode of triggering following the injection but has had constant discomfort to the A1 pulley since.  She is not taking anything for the pain.  The other issue she brings up today is right knee pain.  About a week and a half ago, she bent down and felt a pop to the lateral knee.  She has had discomfort with flexion of the knee since.  She has also noticed occasional swelling.  She has been using a knee sleeve as well as wearing a hinged knee brace which does seem to help.  She does not take anything for the pain.  Review of Systems as detailed in HPI.  All others reviewed and are negative.   Objective: Vital Signs: There were no vitals taken for this visit.  Physical Exam well-developed well-nourished female no acute distress.  Alert and oriented x3.  Ortho Exam right long finger exam shows no reproducible triggering.  She does have moderate tenderness to the A1 pulley.  She is neurovascular intact distally.  Right knee exam shows a trace effusion.  Range of motion 0 to 90 degrees.  Mild tenderness lateral joint line.  No patellofemoral crepitus.  Ligaments are stable.  She is neurovascular intact distally.  Specialty Comments:  No specialty comments available.  Imaging: XR KNEE 3 VIEW RIGHT  Result Date: 05/27/2021 X-rays demonstrate mild to moderate tricompartmental degenerative changes.  No other acute findings.    PMFS History: Patient Active Problem List   Diagnosis Date Noted   Effusion, left knee 12/22/2018   Melanoma ([facility])    IBS (irritable bowel syndrome)    Depression    Anxiety    Melanoma of thigh, right ([facility]) 06/29/2014   Vitamin D deficiency 06/29/2013   Vitamin B 12 deficiency 06/29/2013   Past Medical History:  Diagnosis Date   Anxiety    Depression    Elevated liver enzymes 10/09/2016   RUQ ultrasound -- showed some gallstones   Genital warts    Gestational diabetes 2016   HPV in female    Hx of gallstones 10/2016   Hypertension    IBS  (irritable bowel syndrome)    Melanoma (HCC)    R thigh   Melanoma of thigh, right ([facility]) 2016   PCOS (polycystic ovarian syndrome)    Vitamin B 12 deficiency 2015   Vitamin D deficiency 2015    Family History  Problem Relation Age of Onset   Hypertension Mother    Atrial fibrillation Mother    Hypertension Father    Hyperlipidemia Father    Diabetes Brother     Past Surgical History:  Procedure Laterality Date   CHOLECYSTECTOMY N/A 03/19/2017   Procedure: LAPAROSCOPIC CHOLECYSTECTOMY;  Surgeon: Clovis Riley, MD;  Location: [facility];  Service: General;  Laterality: N/A;   MOLE REMOVAL N/A 2016   Right thigh, Melanoma  in office   WISDOM TOOTH EXTRACTION     Social History   Occupational History   Not on file  Tobacco Use   Smoking status: Never   Smokeless tobacco: Never  Vaping Use   Vaping Use: Never used  Substance and Sexual Activity   Alcohol use: Yes    Comment: 2-3 wines or beer on weekend   Drug use: No   Sexual activity: Yes    Birth control/protection: I.U.D.

## 2021-06-05 ENCOUNTER — Other Ambulatory Visit (HOSPITAL_COMMUNITY): Payer: Self-pay

## 2021-06-05 DIAGNOSIS — F410 Panic disorder [episodic paroxysmal anxiety] without agoraphobia: Secondary | ICD-10-CM | POA: Diagnosis not present

## 2021-06-05 DIAGNOSIS — F4320 Adjustment disorder, unspecified: Secondary | ICD-10-CM | POA: Diagnosis not present

## 2021-06-05 DIAGNOSIS — F411 Generalized anxiety disorder: Secondary | ICD-10-CM | POA: Diagnosis not present

## 2021-06-05 MED ORDER — ONDANSETRON HCL 4 MG PO TABS
ORAL_TABLET | ORAL | 2 refills | Status: DC
  Filled 2021-06-05 – 2021-07-24 (×2): qty 30, 30d supply, fill #0
  Filled 2022-04-06: qty 30, 30d supply, fill #1
  Filled 2022-05-10: qty 30, 30d supply, fill #2

## 2021-06-05 MED ORDER — AMPHETAMINE SULFATE 10 MG PO TABS: ORAL_TABLET | ORAL | 0 refills | Status: DC

## 2021-06-05 MED ORDER — BUSPIRONE HCL 15 MG PO TABS
15.0000 mg | ORAL_TABLET | Freq: Three times a day (TID) | ORAL | 2 refills | Status: DC
  Filled 2021-06-05 – 2021-06-16 (×2): qty 90, 30d supply, fill #0
  Filled 2021-07-24: qty 90, 30d supply, fill #1
  Filled 2021-08-19: qty 90, 30d supply, fill #2

## 2021-06-12 ENCOUNTER — Other Ambulatory Visit (HOSPITAL_COMMUNITY): Payer: Self-pay

## 2021-06-13 ENCOUNTER — Other Ambulatory Visit (HOSPITAL_COMMUNITY): Payer: Self-pay

## 2021-06-16 ENCOUNTER — Other Ambulatory Visit (HOSPITAL_COMMUNITY): Payer: Self-pay

## 2021-06-26 ENCOUNTER — Other Ambulatory Visit (HOSPITAL_COMMUNITY): Payer: Self-pay

## 2021-07-16 ENCOUNTER — Other Ambulatory Visit (HOSPITAL_COMMUNITY): Payer: Self-pay

## 2021-07-16 DIAGNOSIS — F4320 Adjustment disorder, unspecified: Secondary | ICD-10-CM | POA: Diagnosis not present

## 2021-07-16 DIAGNOSIS — F411 Generalized anxiety disorder: Secondary | ICD-10-CM | POA: Diagnosis not present

## 2021-07-16 DIAGNOSIS — F410 Panic disorder [episodic paroxysmal anxiety] without agoraphobia: Secondary | ICD-10-CM | POA: Diagnosis not present

## 2021-07-16 MED ORDER — CLONAZEPAM 0.5 MG PO TABS
ORAL_TABLET | ORAL | 0 refills | Status: DC
  Filled 2021-07-16: qty 60, 30d supply, fill #0

## 2021-07-24 ENCOUNTER — Other Ambulatory Visit (HOSPITAL_COMMUNITY): Payer: Self-pay

## 2021-07-24 MED ORDER — AMPHETAMINE-DEXTROAMPHET ER 10 MG PO CP24
ORAL_CAPSULE | ORAL | 0 refills | Status: DC
  Filled 2021-07-24: qty 30, 30d supply, fill #0

## 2021-07-30 ENCOUNTER — Encounter: Payer: Self-pay | Admitting: Physician Assistant

## 2021-07-30 NOTE — Telephone Encounter (Signed)
See patient's message, okay to increase to 1 mg?

## 2021-07-31 NOTE — Telephone Encounter (Signed)
Left message on voicemail to call office.  

## 2021-08-01 ENCOUNTER — Other Ambulatory Visit (HOSPITAL_COMMUNITY): Payer: Self-pay

## 2021-08-01 MED ORDER — SEMAGLUTIDE (1 MG/DOSE) 4 MG/3ML ~~LOC~~ SOPN
1.0000 mg | PEN_INJECTOR | SUBCUTANEOUS | 0 refills | Status: DC
  Filled 2021-08-01: qty 3, 28d supply, fill #0

## 2021-08-11 ENCOUNTER — Other Ambulatory Visit (HOSPITAL_COMMUNITY): Payer: Self-pay

## 2021-08-13 ENCOUNTER — Other Ambulatory Visit (HOSPITAL_COMMUNITY): Payer: Self-pay

## 2021-08-13 DIAGNOSIS — F331 Major depressive disorder, recurrent, moderate: Secondary | ICD-10-CM | POA: Diagnosis not present

## 2021-08-13 DIAGNOSIS — F4320 Adjustment disorder, unspecified: Secondary | ICD-10-CM | POA: Diagnosis not present

## 2021-08-13 DIAGNOSIS — F411 Generalized anxiety disorder: Secondary | ICD-10-CM | POA: Diagnosis not present

## 2021-08-13 MED ORDER — TIAGABINE HCL 4 MG PO TABS
ORAL_TABLET | ORAL | 0 refills | Status: DC
  Filled 2021-08-13 – 2021-08-27 (×3): qty 30, 30d supply, fill #0

## 2021-08-19 ENCOUNTER — Other Ambulatory Visit (HOSPITAL_COMMUNITY): Payer: Self-pay

## 2021-08-20 ENCOUNTER — Other Ambulatory Visit (HOSPITAL_COMMUNITY): Payer: Self-pay

## 2021-08-21 ENCOUNTER — Other Ambulatory Visit (HOSPITAL_COMMUNITY): Payer: Self-pay

## 2021-08-25 ENCOUNTER — Other Ambulatory Visit (HOSPITAL_COMMUNITY): Payer: Self-pay

## 2021-08-25 ENCOUNTER — Telehealth: Payer: 59 | Admitting: Nurse Practitioner

## 2021-08-25 DIAGNOSIS — J0140 Acute pansinusitis, unspecified: Secondary | ICD-10-CM

## 2021-08-25 MED ORDER — DULOXETINE HCL 60 MG PO CPEP
ORAL_CAPSULE | ORAL | 2 refills | Status: DC
  Filled 2021-08-25: qty 30, 30d supply, fill #0
  Filled 2021-09-22: qty 30, 30d supply, fill #1
  Filled 2021-10-23: qty 30, 30d supply, fill #2

## 2021-08-25 MED ORDER — FLUTICASONE PROPIONATE 50 MCG/ACT NA SUSP
2.0000 | Freq: Every day | NASAL | 6 refills | Status: DC
  Filled 2021-08-25: qty 16, 30d supply, fill #0
  Filled 2022-04-06: qty 16, 30d supply, fill #1

## 2021-08-25 MED ORDER — AMOXICILLIN-POT CLAVULANATE 875-125 MG PO TABS
1.0000 | ORAL_TABLET | Freq: Two times a day (BID) | ORAL | 0 refills | Status: AC
  Filled 2021-08-25: qty 14, 7d supply, fill #0

## 2021-08-25 MED ORDER — AMPHETAMINE-DEXTROAMPHET ER 10 MG PO CP24
ORAL_CAPSULE | ORAL | 0 refills | Status: DC
  Filled 2021-08-25: qty 30, 30d supply, fill #0

## 2021-08-25 NOTE — Progress Notes (Signed)
Virtual Visit Consent   [name], you are scheduled for a virtual visit with a [facility] provider today.     Just as with appointments in the office, your consent must be obtained to participate.  Your consent will be active for this visit and any virtual visit you may have with one of our providers in the next 365 days.     If you have a MyChart account, a copy of this consent can be sent to you electronically.  All virtual visits are billed to your insurance company just like a traditional visit in the office.    As this is a virtual visit, video technology does not allow for your provider to perform a traditional examination.  This may limit your provider's ability to fully assess your condition.  If your provider identifies any concerns that need to be evaluated in person or the need to arrange testing (such as labs, EKG, etc.), we will make arrangements to do so.     Although advances in technology are sophisticated, we cannot ensure that it will always work on either your end or our end.  If the connection with a video visit is poor, the visit may have to be switched to a telephone visit.  With either a video or telephone visit, we are not always able to ensure that we have a secure connection.     I need to obtain your verbal consent now.   Are you willing to proceed with your visit today?    [name] has provided verbal consent on 08/25/2021 for a virtual visit (video or telephone).   Apolonio Schneiders, FNP   Date: 08/25/2021 12:28 PM   Virtual Visit via Video Note   I, Apolonio Schneiders, connected with  Carolyn Gilmore  ([id number], 1980-10-30) on 08/25/21 at 12:30 PM EST by a video-enabled telemedicine application and verified that I am speaking with the correct person using two identifiers.  Location: Patient: Virtual Visit Location Patient: Home Provider: Virtual Visit Location Provider: Home Office   I discussed the limitations of evaluation and management by telemedicine and the  availability of in person appointments. The patient expressed understanding and agreed to proceed.    History of Present Illness: Carolyn Gilmore is a 41 y.o. who identifies as a female who was assigned female at birth, and is being seen today with complaints of sinus congestion.   She has suffered from frequent sinus infections in the past and was previously under the care of an ENT.   She recently had a sinus infection for over 8 days that she was able to ride out with OTC she felt better for a few days and then symptoms returned and worsened over the past 4 days. She has sinus pressure, green/brown drainage that does have blood in it at times.   She has a slight cough mostly in the am to clear PND   She has been using [facility] OTC for relief   She also uses a Netty Pot for relief   Problems:  Patient Active Problem List   Diagnosis Date Noted   Effusion, left knee 12/22/2018   Melanoma ([place])    IBS (irritable bowel syndrome)    Depression    Anxiety    Melanoma of thigh, right ([place]) 06/29/2014   Vitamin D deficiency 06/29/2013   Vitamin B 12 deficiency 06/29/2013    Allergies:  Allergies  Allergen Reactions   Bacitracin Other (See Comments)    Blisters and pustules  Medications:  Current Outpatient Medications:    Amphetamine Sulfate 10 MG TABS, Take 10 mg by mouth daily as needed. , Disp: , Rfl:    Amphetamine Sulfate 10 MG TABS, Take 1/2 tablet by mouth 2 times a day, Disp: 30 tablet, Rfl: 0   amphetamine-dextroamphetamine (ADDERALL XR) 10 MG 24 hr capsule, Take 1 capsule  by mouth daily in the morning, Disp: 30 capsule, Rfl: 0   busPIRone (BUSPAR) 10 MG tablet, Take 1 tablet by mouth 3 times a day, Disp: 90 tablet, Rfl: 1   busPIRone (BUSPAR) 15 MG tablet, Take 1 tablet (15 mg total) by mouth 3 (three) times daily., Disp: 90 tablet, Rfl: 2   Calcium Carbonate (CALCIUM 500 PO), Take 1 tablet by mouth daily., Disp: , Rfl:    clonazePAM (KLONOPIN) 0.5 MG tablet, Take 0.5  mg by mouth daily as needed for anxiety (2 tablets daily)., Disp: , Rfl:    clonazePAM (KLONOPIN) 0.5 MG tablet, Take one tablet by mouth 2 times daily as needed for anxiety., Disp: 60 tablet, Rfl: 0   DULoxetine (CYMBALTA) 30 MG capsule, Take 1 capsule by mouth at 5pm, Disp: 30 capsule, Rfl: 2   DULoxetine (CYMBALTA) 30 MG capsule, Take 1 capsule by mouth daily at 5pm, Disp: 30 capsule, Rfl: 2   DULoxetine (CYMBALTA) 60 MG capsule, Take 1 capsule  by mouth daily in morning, Disp: 30 capsule, Rfl: 2   hydrOXYzine (ATARAX/VISTARIL) 10 MG tablet, Take 2 tablets by mouth 2 times a day as needed for anxiety, Disp: 120 tablet, Rfl: 1   levonorgestrel (KYLEENA) 19.5 MG IUD, by Intrauterine route. Placed July 2018, needs to be removed 2023., Disp: , Rfl:    Multiple Vitamin (MULTIVITAMIN) tablet, Take 1 tablet by mouth daily., Disp: , Rfl:    ondansetron (ZOFRAN) 4 MG tablet, Take 4 mg by mouth at bedtime., Disp: , Rfl:    ondansetron (ZOFRAN) 4 MG tablet, Take 1 tablet by mouth daily as needed for nausea, Disp: 30 tablet, Rfl: 2   propranolol (INDERAL) 10 MG tablet, Take one tablet by mouth daily as needed for anxiety., Disp: 30 tablet, Rfl: 0   Semaglutide, 1 MG/DOSE, 4 MG/3ML SOPN, Inject 1 mg as directed once a week., Disp: 3 mL, Rfl: 0   tiaGABine (GABITRIL) 4 MG tablet, Take 1/2 tablet (2mg ) by mouth at bedtime for a few days for anxiety and may increase to one tablet by mouth at bedtime if tolerated, Disp: 30 tablet, Rfl: 0   VITAMIN D, CHOLECALCIFEROL, PO, Take 1,000 Units by mouth daily., Disp: , Rfl:   Observations/Objective: Patient is well-developed, well-nourished in no acute distress.  Resting comfortably at home.  Head is normocephalic, atraumatic.  No labored breathing.  Speech is clear and coherent with logical content.  Patient is alert and oriented at baseline.    Assessment and Plan: 1. Acute non-recurrent pansinusitis  - amoxicillin-clavulanate (AUGMENTIN) 875-125 MG tablet;  Take 1 tablet by mouth 2 (two) times daily for 7 days. Take with food  Dispense: 14 tablet; Refill: 0 - fluticasone (FLONASE) 50 MCG/ACT nasal spray; Place 2 sprays into both nostrils daily.  Dispense: 16 g; Refill: 6     Follow Up Instructions: I discussed the assessment and treatment plan with the patient. The patient was provided an opportunity to ask questions and all were answered. The patient agreed with the plan and demonstrated an understanding of the instructions.  A copy of instructions were sent to the patient via MyChart unless otherwise  noted below.    The patient was advised to call back or seek an in-person evaluation if the symptoms worsen or if the condition fails to improve as anticipated.  Time:  I spent 10 minutes with the patient via telehealth technology discussing the above problems/concerns.    Apolonio Schneiders, FNP

## 2021-08-27 ENCOUNTER — Other Ambulatory Visit (HOSPITAL_COMMUNITY): Payer: Self-pay

## 2021-08-27 DIAGNOSIS — F4323 Adjustment disorder with mixed anxiety and depressed mood: Secondary | ICD-10-CM | POA: Diagnosis not present

## 2021-08-27 DIAGNOSIS — F331 Major depressive disorder, recurrent, moderate: Secondary | ICD-10-CM | POA: Diagnosis not present

## 2021-08-27 DIAGNOSIS — F411 Generalized anxiety disorder: Secondary | ICD-10-CM | POA: Diagnosis not present

## 2021-08-27 MED ORDER — AMPHETAMINE-DEXTROAMPHETAMINE 10 MG PO TABS
ORAL_TABLET | ORAL | 0 refills | Status: DC
Start: 2021-08-27 — End: 2021-12-15
  Filled 2021-08-27: qty 30, 30d supply, fill #0

## 2021-08-28 ENCOUNTER — Other Ambulatory Visit (HOSPITAL_COMMUNITY): Payer: Self-pay

## 2021-09-09 DIAGNOSIS — F4323 Adjustment disorder with mixed anxiety and depressed mood: Secondary | ICD-10-CM | POA: Diagnosis not present

## 2021-09-09 DIAGNOSIS — F331 Major depressive disorder, recurrent, moderate: Secondary | ICD-10-CM | POA: Diagnosis not present

## 2021-09-09 DIAGNOSIS — F411 Generalized anxiety disorder: Secondary | ICD-10-CM | POA: Diagnosis not present

## 2021-09-11 DIAGNOSIS — F410 Panic disorder [episodic paroxysmal anxiety] without agoraphobia: Secondary | ICD-10-CM | POA: Diagnosis not present

## 2021-09-11 DIAGNOSIS — F4320 Adjustment disorder, unspecified: Secondary | ICD-10-CM | POA: Diagnosis not present

## 2021-09-11 DIAGNOSIS — F411 Generalized anxiety disorder: Secondary | ICD-10-CM | POA: Diagnosis not present

## 2021-09-12 ENCOUNTER — Telehealth: Payer: 59 | Admitting: Physician Assistant

## 2021-09-12 DIAGNOSIS — H1030 Unspecified acute conjunctivitis, unspecified eye: Secondary | ICD-10-CM

## 2021-09-12 MED ORDER — OFLOXACIN 0.3 % OP SOLN: OPHTHALMIC | 0 refills | Status: DC

## 2021-09-12 NOTE — Patient Instructions (Signed)
?Janine Ores, thank you for joining Rodney Booze, PA-C for today's virtual visit.  While this provider is not your primary care provider (PCP), if your PCP is located in our provider database this encounter information will be shared with them immediately following your visit. ? ?Consent: ?(Patient) Carolyn Gilmore provided verbal consent for this virtual visit at the beginning of the encounter. ? ?Current Medications: ? ?Current Outpatient Medications:  ?  ofloxacin (OCUFLOX) 0.3 % ophthalmic solution, Instill 1-2 drops in affected eye(s) every 2 to 4 hours for 2 days, then 1-2 drops 4 times daily on days 3-7, Disp: 5 mL, Rfl: 0 ?  Amphetamine Sulfate 10 MG TABS, Take 10 mg by mouth daily as needed. , Disp: , Rfl:  ?  Amphetamine Sulfate 10 MG TABS, Take 1/2 tablet by mouth 2 times a day, Disp: 30 tablet, Rfl: 0 ?  amphetamine-dextroamphetamine (ADDERALL XR) 10 MG 24 hr capsule, Take 1 capsule  by mouth daily in the morning, Disp: 30 capsule, Rfl: 0 ?  amphetamine-dextroamphetamine (ADDERALL) 10 MG tablet, Take 1/2 tablet by mouth at lunch and 1/2 tablet daily at 3pm, Disp: 30 tablet, Rfl: 0 ?  busPIRone (BUSPAR) 10 MG tablet, Take 1 tablet by mouth 3 times a day, Disp: 90 tablet, Rfl: 1 ?  busPIRone (BUSPAR) 15 MG tablet, Take 1 tablet (15 mg total) by mouth 3 (three) times daily., Disp: 90 tablet, Rfl: 2 ?  Calcium Carbonate (CALCIUM 500 PO), Take 1 tablet by mouth daily., Disp: , Rfl:  ?  clonazePAM (KLONOPIN) 0.5 MG tablet, Take 0.5 mg by mouth daily as needed for anxiety (2 tablets daily)., Disp: , Rfl:  ?  clonazePAM (KLONOPIN) 0.5 MG tablet, Take one tablet by mouth 2 times daily as needed for anxiety., Disp: 60 tablet, Rfl: 0 ?  DULoxetine (CYMBALTA) 30 MG capsule, Take 1 capsule by mouth at 5pm, Disp: 30 capsule, Rfl: 2 ?  DULoxetine (CYMBALTA) 30 MG capsule, Take 1 capsule by mouth daily at 5pm, Disp: 30 capsule, Rfl: 2 ?  DULoxetine (CYMBALTA) 60 MG capsule, Take 1 capsule  by mouth daily in morning,  Disp: 30 capsule, Rfl: 2 ?  fluticasone (FLONASE) 50 MCG/ACT nasal spray, Place 2 sprays into both nostrils daily., Disp: 16 g, Rfl: 6 ?  hydrOXYzine (ATARAX/VISTARIL) 10 MG tablet, Take 2 tablets by mouth 2 times a day as needed for anxiety, Disp: 120 tablet, Rfl: 1 ?  levonorgestrel (KYLEENA) 19.5 MG IUD, by Intrauterine route. Placed July 2018, needs to be removed 2023., Disp: , Rfl:  ?  Multiple Vitamin (MULTIVITAMIN) tablet, Take 1 tablet by mouth daily., Disp: , Rfl:  ?  ondansetron (ZOFRAN) 4 MG tablet, Take 4 mg by mouth at bedtime., Disp: , Rfl:  ?  ondansetron (ZOFRAN) 4 MG tablet, Take 1 tablet by mouth daily as needed for nausea, Disp: 30 tablet, Rfl: 2 ?  propranolol (INDERAL) 10 MG tablet, Take one tablet by mouth daily as needed for anxiety., Disp: 30 tablet, Rfl: 0 ?  Semaglutide, 1 MG/DOSE, 4 MG/3ML SOPN, Inject 1 mg as directed once a week., Disp: 3 mL, Rfl: 0 ?  tiaGABine (GABITRIL) 4 MG tablet, Take 1/2 tablet ('2mg'$ ) by mouth at bedtime for a few days for anxiety and may increase to one tablet by mouth at bedtime if tolerated, Disp: 30 tablet, Rfl: 0 ?  VITAMIN D, CHOLECALCIFEROL, PO, Take 1,000 Units by mouth daily., Disp: , Rfl:   ? ?Medications ordered in this encounter:  ?Meds ordered  this encounter  ?Medications  ? ofloxacin (OCUFLOX) 0.3 % ophthalmic solution  ?  Sig: Instill 1-2 drops in affected eye(s) every 2 to 4 hours for 2 days, then 1-2 drops 4 times daily on days 3-7  ?  Dispense:  5 mL  ?  Refill:  0  ?  Order Specific Question:   Supervising Provider  ?  Answer:   Noemi Chapel [[id number]]  ?  ? ?*If you need refills on other medications prior to your next appointment, please contact your pharmacy* ? ?Follow-Up: ?Call back or seek an in-person evaluation if the symptoms worsen or if the condition fails to improve as anticipated. ? ?Other Instructions ?Use the antibiotic drops as directed  ? ?Do not wear your contacts until you are finished with the medication and your symptoms have  completely resolved ? ?Follow up with your regular doctor in 1 week for reassessment and seek care sooner if your symptoms worsen or fail to improve. ? ? ? ?If you have been instructed to have an in-person evaluation today at a local Urgent Care facility, please use the link below. It will take you to a list of all of our available [facility], including address, phone number and hours of operation. Please do not delay care.  ?[facility] ? ?If you or a family member do not have a primary care provider, use the link below to schedule a visit and establish care. When you choose a [facility] primary care physician or advanced practice provider, you gain a long-term partner in health. ?Find a Primary Care Provider ? ?Learn more about [facility] in-office and virtual care options: ?[facility] ? ?

## 2021-09-12 NOTE — Progress Notes (Signed)
[name], Carolyn Gilmore are scheduled for a virtual visit with your provider today.   ? ?Just as we do with appointments in the office, we must obtain your consent to participate.  Your consent will be active for this visit and any virtual visit you may have with one of our providers in the next 365 days.   ? ?If you have a MyChart account, I can also send a copy of this consent to you electronically.  All virtual visits are billed to your insurance company just like a traditional visit in the office.  As this is a virtual visit, video technology does not allow for your provider to perform a traditional examination.  This may limit your provider's ability to fully assess your condition.  If your provider identifies any concerns that need to be evaluated in person or the need to arrange testing such as labs, EKG, etc, we will make arrangements to do so.   ? ?Although advances in technology are sophisticated, we cannot ensure that it will always work on either your end or our end.  If the connection with a video visit is poor, we may have to switch to a telephone visit.  With either a video or telephone visit, we are not always able to ensure that we have a secure connection.   I need to obtain your verbal consent now.   Are you willing to proceed with your visit today?  ? ?Carolyn Gilmore has provided verbal consent on 09/12/2021 for a virtual visit (video or telephone). ? ? ?Carolyn Stfort Gilman Schmidt, PA-C ?09/12/2021  8:26 AM ? ? ?Date:  09/12/2021  ? ?ID:  Carolyn Gilmore, DOB [birth year], MRN [id number] ? ?Patient Location: Home ?Provider Location: Home Office ? ? ?Participants: Patient and Provider for Visit and Wrap up ? ?Method of visit: Video  ?Location of Patient: Home ?Location of Provider: Home Office ?Consent was obtain for visit over the video. ?Services rendered by provider: Visit was performed via video ? ?A video enabled telemedicine application was used and I verified that I am speaking with the correct person using two  identifiers. ? ?PCP:  Inda Coke, PA  ? ?Chief Complaint:  eye problem ? ?History of Present Illness:   ? ?Carolyn Gilmore is a 41 y.o. female with history as stated below. ?Presents video telehealth for an acute care visit ? ?Pt states that her son's kindergarten class recently had a pink eye outbreak. She started having itchiness and watering to the left eye last night. This am she woke up with purulent drainage from the left eye. She wears contacts. ? ?Denies eye pain or vision changes. ? ?Past Medical, Surgical, Social History, Allergies, and Medications have been Reviewed. ? ?Past Medical History:  ?Diagnosis Date  ? Anxiety   ? Depression   ? Elevated liver enzymes 10/09/2016  ? RUQ ultrasound -- showed some gallstones  ? Genital warts   ? Gestational diabetes 2016  ? HPV in female   ? Hx of gallstones 10/2016  ? Hypertension   ? IBS (irritable bowel syndrome)   ? Melanoma ([place])   ? R thigh  ? Melanoma of thigh, right ([facility]) 2016  ? PCOS (polycystic ovarian syndrome)   ? Vitamin B 12 deficiency 2015  ? Vitamin D deficiency 2015  ? ? ?Current Meds  ?Medication Sig  ? ofloxacin (OCUFLOX) 0.3 % ophthalmic solution Instill 1-2 drops in affected eye(s) every 2 to 4 hours for 2 days, then 1-2 drops 4 times daily on days 3-7  ?  ? ?  Allergies:   Bacitracin  ? ?ROS ?See HPI for history of present illness. ? ?Physical Exam ?Constitutional:   ?   Appearance: Normal appearance.  ?Neurological:  ?   Mental Status: She is alert.  ? ?          ?MDM: Likely bacterial conjunctivitis with recent exposure and sxs today. No vision changes or eye pain. Ofloxacin sent to pharmacy ? ?There are no diagnoses linked to this encounter. ? ? ?Time:   ?Today, I have spent 9 minutes with the patient with telehealth technology discussing the above problems, reviewing the chart, previous notes, medications and orders.  ? ? ?Tests Ordered: ?No orders of the defined types were placed in this encounter. ? ? ?Medication Changes: ?Meds ordered  this encounter  ?Medications  ? ofloxacin (OCUFLOX) 0.3 % ophthalmic solution  ?  Sig: Instill 1-2 drops in affected eye(s) every 2 to 4 hours for 2 days, then 1-2 drops 4 times daily on days 3-7  ?  Dispense:  5 mL  ?  Refill:  0  ?  Order Specific Question:   Supervising Provider  ?  Answer:   Noemi Chapel [[id number]]  ? ? ? ?Disposition:  Follow up  ?Signed, ?Illias Pantano Gilman Schmidt, PA-C  ?09/12/2021 8:26 AM    ? ? ?

## 2021-09-22 ENCOUNTER — Other Ambulatory Visit (HOSPITAL_COMMUNITY): Payer: Self-pay

## 2021-09-23 ENCOUNTER — Other Ambulatory Visit (HOSPITAL_COMMUNITY): Payer: Self-pay

## 2021-09-24 ENCOUNTER — Other Ambulatory Visit (HOSPITAL_COMMUNITY): Payer: Self-pay

## 2021-09-24 MED ORDER — HYDROXYZINE HCL 10 MG PO TABS
20.0000 mg | ORAL_TABLET | Freq: Two times a day (BID) | ORAL | 1 refills | Status: DC | PRN
  Filled 2021-09-24: qty 120, 30d supply, fill #0

## 2021-09-24 MED ORDER — AMPHETAMINE-DEXTROAMPHET ER 10 MG PO CP24
ORAL_CAPSULE | ORAL | 0 refills | Status: DC
  Filled 2021-09-24: qty 30, 30d supply, fill #0

## 2021-09-24 MED ORDER — TIAGABINE HCL 4 MG PO TABS
ORAL_TABLET | ORAL | 0 refills | Status: DC
  Filled 2021-09-24: qty 30, 30d supply, fill #0

## 2021-09-24 MED ORDER — AMPHETAMINE-DEXTROAMPHETAMINE 10 MG PO TABS
ORAL_TABLET | ORAL | 0 refills | Status: DC
  Filled 2021-09-24: qty 30, 30d supply, fill #0

## 2021-09-24 MED ORDER — BUSPIRONE HCL 15 MG PO TABS
15.0000 mg | ORAL_TABLET | Freq: Three times a day (TID) | ORAL | 2 refills | Status: DC
  Filled 2021-09-24: qty 90, 30d supply, fill #0
  Filled 2021-10-23: qty 90, 30d supply, fill #1
  Filled 2021-11-25: qty 90, 30d supply, fill #2

## 2021-09-25 ENCOUNTER — Other Ambulatory Visit (HOSPITAL_COMMUNITY): Payer: Self-pay

## 2021-09-26 ENCOUNTER — Other Ambulatory Visit (HOSPITAL_COMMUNITY): Payer: Self-pay

## 2021-09-26 DIAGNOSIS — F4321 Adjustment disorder with depressed mood: Secondary | ICD-10-CM | POA: Diagnosis not present

## 2021-09-26 DIAGNOSIS — F332 Major depressive disorder, recurrent severe without psychotic features: Secondary | ICD-10-CM | POA: Diagnosis not present

## 2021-09-26 DIAGNOSIS — F411 Generalized anxiety disorder: Secondary | ICD-10-CM | POA: Diagnosis not present

## 2021-10-03 ENCOUNTER — Other Ambulatory Visit (HOSPITAL_COMMUNITY): Payer: Self-pay

## 2021-10-15 DIAGNOSIS — F4323 Adjustment disorder with mixed anxiety and depressed mood: Secondary | ICD-10-CM | POA: Diagnosis not present

## 2021-10-15 DIAGNOSIS — F411 Generalized anxiety disorder: Secondary | ICD-10-CM | POA: Diagnosis not present

## 2021-10-15 DIAGNOSIS — F331 Major depressive disorder, recurrent, moderate: Secondary | ICD-10-CM | POA: Diagnosis not present

## 2021-10-23 ENCOUNTER — Other Ambulatory Visit (HOSPITAL_COMMUNITY): Payer: Self-pay

## 2021-10-24 ENCOUNTER — Other Ambulatory Visit (HOSPITAL_COMMUNITY): Payer: Self-pay

## 2021-10-29 ENCOUNTER — Other Ambulatory Visit (HOSPITAL_COMMUNITY): Payer: Self-pay

## 2021-10-30 ENCOUNTER — Other Ambulatory Visit (HOSPITAL_COMMUNITY): Payer: Self-pay

## 2021-10-30 MED ORDER — TIAGABINE HCL 4 MG PO TABS
ORAL_TABLET | ORAL | 2 refills | Status: DC
  Filled 2021-10-30: qty 60, 30d supply, fill #0

## 2021-10-31 ENCOUNTER — Other Ambulatory Visit (HOSPITAL_COMMUNITY): Payer: Self-pay

## 2021-11-05 ENCOUNTER — Other Ambulatory Visit (HOSPITAL_COMMUNITY): Payer: Self-pay

## 2021-11-05 DIAGNOSIS — F411 Generalized anxiety disorder: Secondary | ICD-10-CM | POA: Diagnosis not present

## 2021-11-05 DIAGNOSIS — F4323 Adjustment disorder with mixed anxiety and depressed mood: Secondary | ICD-10-CM | POA: Diagnosis not present

## 2021-11-05 MED ORDER — TIAGABINE HCL 16 MG PO TABS
ORAL_TABLET | ORAL | 1 refills | Status: DC
  Filled 2021-11-05: qty 30, 30d supply, fill #0

## 2021-11-06 ENCOUNTER — Other Ambulatory Visit (HOSPITAL_COMMUNITY): Payer: Self-pay

## 2021-11-10 ENCOUNTER — Other Ambulatory Visit (HOSPITAL_COMMUNITY): Payer: Self-pay

## 2021-11-14 ENCOUNTER — Other Ambulatory Visit (HOSPITAL_COMMUNITY): Payer: Self-pay

## 2021-11-25 ENCOUNTER — Other Ambulatory Visit (HOSPITAL_COMMUNITY): Payer: Self-pay

## 2021-11-26 ENCOUNTER — Other Ambulatory Visit (HOSPITAL_COMMUNITY): Payer: Self-pay

## 2021-11-26 MED ORDER — DULOXETINE HCL 60 MG PO CPEP
60.0000 mg | ORAL_CAPSULE | Freq: Every morning | ORAL | 2 refills | Status: DC
  Filled 2021-11-26: qty 30, 30d supply, fill #0

## 2021-12-03 DIAGNOSIS — F331 Major depressive disorder, recurrent, moderate: Secondary | ICD-10-CM | POA: Diagnosis not present

## 2021-12-03 DIAGNOSIS — F411 Generalized anxiety disorder: Secondary | ICD-10-CM | POA: Diagnosis not present

## 2021-12-03 DIAGNOSIS — F4323 Adjustment disorder with mixed anxiety and depressed mood: Secondary | ICD-10-CM | POA: Diagnosis not present

## 2021-12-14 NOTE — Progress Notes (Unsigned)
New Patient Note  RE: Carolyn Gilmore MRN: [id number] DOB: 1980-12-13 Date of Office Visit: 12/15/2021  Consult requested by: Inda Coke, [place] Primary care provider: Inda Coke, [place]  Chief Complaint: No chief complaint on file.  History of Present Illness: I had the pleasure of seeing Carolyn Gilmore for initial evaluation at the [facility] on 12/14/2021. She is a 41 y.o. female, who is referred here by Inda Coke, PA for the evaluation of ***.  ***  Assessment and Plan: Carolyn Gilmore is a 41 y.o. female with: No problem-specific Assessment & Plan notes found for this encounter.  No follow-ups on file.  No orders of the defined types were placed in this encounter.  Lab Orders  No laboratory test(s) ordered today    Other allergy screening: Asthma: {Blank single:19197::"yes","no"} Rhino conjunctivitis: {Blank single:19197::"yes","no"} Food allergy: {Blank single:19197::"yes","no"} Medication allergy: {Blank single:19197::"yes","no"} Hymenoptera allergy: {Blank single:19197::"yes","no"} Urticaria: {Blank single:19197::"yes","no"} Eczema:{Blank single:19197::"yes","no"} History of recurrent infections suggestive of immunodeficency: {Blank single:19197::"yes","no"}  Diagnostics: Spirometry:  Tracings reviewed. Her effort: {Blank single:19197::"Good reproducible efforts.","It was hard to get consistent efforts and there is a question as to whether this reflects a maximal maneuver.","Poor effort, data can not be interpreted."} FVC: ***L FEV1: ***L, ***% predicted FEV1/FVC ratio: ***% Interpretation: {Blank single:19197::"Spirometry consistent with mild obstructive disease","Spirometry consistent with moderate obstructive disease","Spirometry consistent with severe obstructive disease","Spirometry consistent with possible restrictive disease","Spirometry consistent with mixed obstructive and restrictive disease","Spirometry uninterpretable due to  technique","Spirometry consistent with normal pattern","No overt abnormalities noted given today's efforts"}.  Please see scanned spirometry results for details.  Skin Testing: {Blank single:19197::"Select foods","Environmental allergy panel","Environmental allergy panel and select foods","Food allergy panel","None","Deferred due to recent antihistamines use"}. *** Results discussed with patient/family.   Past Medical History: Patient Active Problem List   Diagnosis Date Noted  . Effusion, left knee 12/22/2018  . Melanoma ([place])   . IBS (irritable bowel syndrome)   . Depression   . Anxiety   . Melanoma of thigh, right ([place]) 06/29/2014  . Vitamin D deficiency 06/29/2013  . Vitamin B 12 deficiency 06/29/2013   Past Medical History:  Diagnosis Date  . Anxiety   . Depression   . Elevated liver enzymes 10/09/2016   RUQ ultrasound -- showed some gallstones  . Genital warts   . Gestational diabetes 2016  . HPV in female   . Hx of gallstones 10/2016  . Hypertension   . IBS (irritable bowel syndrome)   . Melanoma ([place])    R thigh  . Melanoma of thigh, right ([place]) 2016  . PCOS (polycystic ovarian syndrome)   . Vitamin B 12 deficiency 2015  . Vitamin D deficiency 2015   Past Surgical History: Past Surgical History:  Procedure Laterality Date  . CHOLECYSTECTOMY N/A 03/19/2017   Procedure: LAPAROSCOPIC CHOLECYSTECTOMY;  Surgeon: Clovis Riley, MD;  Location: [facility];  Service: General;  Laterality: N/A;  . MOLE REMOVAL N/A 2016   Right thigh, Melanoma  in office  . WISDOM TOOTH EXTRACTION     Medication List:  Current Outpatient Medications  Medication Sig Dispense Refill  . Amphetamine Sulfate 10 MG TABS Take 10 mg by mouth daily as needed.     . Amphetamine Sulfate 10 MG TABS Take 1/2 tablet by mouth 2 times a day 30 tablet 0  . amphetamine-dextroamphetamine (ADDERALL XR) 10 MG 24 hr capsule Take 1 capsule  by mouth daily in the morning 30 capsule 0  .  amphetamine-dextroamphetamine (ADDERALL XR) 10 MG 24 hr capsule Take  1 capsule (10 mg) by mouth daily in the morning 30 capsule 0  . amphetamine-dextroamphetamine (ADDERALL) 10 MG tablet Take 1/2 tablet by mouth at lunch and 1/2 tablet daily at 3pm 30 tablet 0  . amphetamine-dextroamphetamine (ADDERALL) 10 MG tablet Take 1/2 tablet by mouth at lunch and 1/2 tablet at 3pm 30 tablet 0  . busPIRone (BUSPAR) 10 MG tablet Take 1 tablet by mouth 3 times a day 90 tablet 1  . busPIRone (BUSPAR) 15 MG tablet Take 1 tablet (15 mg total) by mouth 3 (three) times daily. 90 tablet 2  . Calcium Carbonate (CALCIUM 500 PO) Take 1 tablet by mouth daily.    . clonazePAM (KLONOPIN) 0.5 MG tablet Take 0.5 mg by mouth daily as needed for anxiety (2 tablets daily).    . clonazePAM (KLONOPIN) 0.5 MG tablet Take one tablet by mouth 2 times daily as needed for anxiety. 60 tablet 0  . DULoxetine (CYMBALTA) 30 MG capsule Take 1 capsule by mouth at 5pm 30 capsule 2  . DULoxetine (CYMBALTA) 30 MG capsule Take 1 capsule by mouth daily at 5pm 30 capsule 2  . DULoxetine (CYMBALTA) 60 MG capsule Take 1 capsule  by mouth daily in morning 30 capsule 2  . fluticasone (FLONASE) 50 MCG/ACT nasal spray Place 2 sprays into both nostrils daily. 16 g 6  . hydrOXYzine (ATARAX) 10 MG tablet Take 2 tablets (20 mg total) by mouth 2 (two) times daily as needed for anxiety 120 tablet 1  . hydrOXYzine (ATARAX/VISTARIL) 10 MG tablet Take 2 tablets by mouth 2 times a day as needed for anxiety 120 tablet 1  . levonorgestrel (KYLEENA) 19.5 MG IUD by Intrauterine route. Placed July 2018, needs to be removed 2023.    . Multiple Vitamin (MULTIVITAMIN) tablet Take 1 tablet by mouth daily.    Marland Kitchen ofloxacin (OCUFLOX) 0.3 % ophthalmic solution Instill 1-2 drops in affected eye(s) every 2 to 4 hours for 2 days, then 1-2 drops 4 times daily on days 3-7 5 mL 0  . ondansetron (ZOFRAN) 4 MG tablet Take 4 mg by mouth at bedtime.    . ondansetron (ZOFRAN) 4 MG  tablet Take 1 tablet by mouth daily as needed for nausea 30 tablet 2  . propranolol (INDERAL) 10 MG tablet Take one tablet by mouth daily as needed for anxiety. 30 tablet 0  . Semaglutide, 1 MG/DOSE, 4 MG/3ML SOPN Inject 1 mg as directed once a week. 3 mL 0  . tiagabine (GABITRIL) 16 MG tablet Take 1 tablet (16 mg) by mouth with food in the evening 30 tablet 1  . tiaGABine (GABITRIL) 4 MG tablet Take 1/2 tablet ('2mg'$ ) by mouth at bedtime for a few days for anxiety and may increase to one tablet by mouth at bedtime if tolerated 30 tablet 0  . VITAMIN D, CHOLECALCIFEROL, PO Take 1,000 Units by mouth daily.     No current facility-administered medications for this visit.   Allergies: Allergies  Allergen Reactions  . Bacitracin Other (See Comments)    Blisters and pustules   Social History: Social History   Socioeconomic History  . Marital status: Married    Spouse name: Not on file  . Number of children: Not on file  . Years of education: Not on file  . Highest education level: Not on file  Occupational History  . Not on file  Tobacco Use  . Smoking status: Never  . Smokeless tobacco: Never  Vaping Use  . Vaping Use: Never used  Substance and Sexual Activity  . Alcohol use: Yes    Comment: 2-3 wines or beer on weekend  . Drug use: No  . Sexual activity: Yes    Birth control/protection: I.U.D.  Other Topics Concern  . Not on file  Social History Narrative   Was living in [place] in 15-16 years   Now moving here to be close to dad   Married with 1 son   Social Determinants of Health   Financial Resource Strain: Not on file  Food Insecurity: Not on file  Transportation Needs: Not on file  Physical Activity: Not on file  Stress: Not on file  Social Connections: Not on file   Lives in a ***. Smoking: *** Occupation: ***  Environmental HistoryFreight forwarder in the house: Estate agent in the family room: {Blank  single:19197::"yes","no"} Carpet in the bedroom: {Blank single:19197::"yes","no"} Heating: {Blank single:19197::"electric","gas","heat pump"} Cooling: {Blank single:19197::"central","window","heat pump"} Pet: {Blank single:19197::"yes ***","no"}  Family History: Family History  Problem Relation Age of Onset  . Hypertension Mother   . Atrial fibrillation Mother   . Hypertension Father   . Hyperlipidemia Father   . Diabetes Brother    Problem                               Relation Asthma                                   *** Eczema                                *** Food allergy                          *** Allergic rhino conjunctivitis     ***  Review of Systems  Constitutional:  Negative for appetite change, chills, fever and unexpected weight change.  HENT:  Negative for congestion and rhinorrhea.   Eyes:  Negative for itching.  Respiratory:  Negative for cough, chest tightness, shortness of breath and wheezing.   Cardiovascular:  Negative for chest pain.  Gastrointestinal:  Negative for abdominal pain.  Genitourinary:  Negative for difficulty urinating.  Skin:  Negative for rash.  Neurological:  Negative for headaches.   Objective: There were no vitals taken for this visit. There is no height or weight on file to calculate BMI. Physical Exam Vitals and nursing note reviewed.  Constitutional:      Appearance: Normal appearance. She is well-developed.  HENT:     Head: Normocephalic and atraumatic.     Right Ear: Tympanic membrane and external ear normal.     Left Ear: Tympanic membrane and external ear normal.     Nose: Nose normal.     Mouth/Throat:     Mouth: Mucous membranes are moist.     Pharynx: Oropharynx is clear.  Eyes:     Conjunctiva/sclera: Conjunctivae normal.  Cardiovascular:     Rate and Rhythm: Normal rate and regular rhythm.     Heart sounds: Normal heart sounds. No murmur heard.    No friction rub. No gallop.  Pulmonary:     Effort: Pulmonary  effort is normal.     Breath sounds: Normal breath sounds. No wheezing, rhonchi or rales.  Musculoskeletal:     Cervical back: Neck  supple.  Skin:    General: Skin is warm.     Findings: No rash.  Neurological:     Mental Status: She is alert and oriented to person, place, and time.  Psychiatric:        Behavior: Behavior normal.  The plan was reviewed with the patient/family, and all questions/concerned were addressed.  It was my pleasure to see Carolyn Gilmore today and participate in her care. Please feel free to contact me with any questions or concerns.  Sincerely,  Rexene Alberts, DO Allergy & Immunology  [facility] office: [place] office: [phone]

## 2021-12-15 ENCOUNTER — Encounter: Payer: Self-pay | Admitting: Allergy

## 2021-12-15 ENCOUNTER — Ambulatory Visit (INDEPENDENT_AMBULATORY_CARE_PROVIDER_SITE_OTHER): Payer: 59 | Admitting: Allergy

## 2021-12-15 VITALS — BP 120/78 | HR 82 | Temp 97.8°F | Resp 18 | Ht 69.5 in | Wt 273.2 lb

## 2021-12-15 DIAGNOSIS — J310 Chronic rhinitis: Secondary | ICD-10-CM | POA: Diagnosis not present

## 2021-12-15 NOTE — Patient Instructions (Addendum)
Today's skin testing showed: Negative to indoor/outdoor allergens.  Results given.  Rhinitis I question if you had a prolonged sinusitis episode this spring. Monitor symptoms. If you notice symptoms again next year then let us know. May need bloodwork and/or ENT referral next.  May use Flonase (fluticasone) nasal spray 1 spray per nostril twice a day as needed for nasal congestion.  Nasal saline spray (i.e., Simply Saline) or nasal saline lavage (i.e., NeilMed) is recommended as needed and prior to medicated nasal sprays. May use refresh eye drops as needed.   Follow up if needed.

## 2021-12-15 NOTE — Assessment & Plan Note (Signed)
2 month history of persistent rhino conjunctivitis symptoms. No allergies as a child or younger adult. Used zyrtec and Flonase with some benefit. Now feeling better. No prior allergy evaluation. Saw ENT in the past for frequent sinusitis.  Today's skin testing showed: Negative to indoor/outdoor allergens. . Based on today's skin testing results and clinical history I question if patient had prolonged sinusitis episode.  . Monitor symptoms. o If you notice symptoms again next year then let us know. May need bloodwork and/or ENT referral next.  . May use Flonase (fluticasone) nasal spray 1 spray per nostril twice a day as needed for nasal congestion.  . Nasal saline spray (i.e., Simply Saline) or nasal saline lavage (i.e., NeilMed) is recommended as needed and prior to medicated nasal sprays. . May use refresh eye drops as needed.

## 2021-12-22 ENCOUNTER — Other Ambulatory Visit (HOSPITAL_COMMUNITY): Payer: Self-pay

## 2021-12-24 ENCOUNTER — Other Ambulatory Visit (HOSPITAL_COMMUNITY): Payer: Self-pay

## 2021-12-24 MED ORDER — DULOXETINE HCL 60 MG PO CPEP
ORAL_CAPSULE | ORAL | 0 refills | Status: DC
  Filled 2021-12-24: qty 30, 30d supply, fill #0

## 2022-01-19 ENCOUNTER — Other Ambulatory Visit (HOSPITAL_COMMUNITY): Payer: Self-pay

## 2022-02-02 ENCOUNTER — Other Ambulatory Visit (HOSPITAL_COMMUNITY): Payer: Self-pay

## 2022-02-02 DIAGNOSIS — F331 Major depressive disorder, recurrent, moderate: Secondary | ICD-10-CM | POA: Diagnosis not present

## 2022-02-02 DIAGNOSIS — F411 Generalized anxiety disorder: Secondary | ICD-10-CM | POA: Diagnosis not present

## 2022-02-02 DIAGNOSIS — F4323 Adjustment disorder with mixed anxiety and depressed mood: Secondary | ICD-10-CM | POA: Diagnosis not present

## 2022-02-03 ENCOUNTER — Other Ambulatory Visit (HOSPITAL_COMMUNITY): Payer: Self-pay

## 2022-02-03 MED ORDER — LAMOTRIGINE 25 MG PO TABS
ORAL_TABLET | ORAL | 0 refills | Status: DC
  Filled 2022-02-03: qty 30, 30d supply, fill #0

## 2022-02-03 MED ORDER — DULOXETINE HCL 60 MG PO CPEP
ORAL_CAPSULE | ORAL | 3 refills | Status: DC
  Filled 2022-02-03: qty 30, 30d supply, fill #0
  Filled 2022-03-10: qty 30, 30d supply, fill #1
  Filled 2022-04-06: qty 30, 30d supply, fill #2
  Filled 2022-05-10: qty 30, 30d supply, fill #3

## 2022-02-03 MED ORDER — HYDROXYZINE HCL 10 MG PO TABS
ORAL_TABLET | ORAL | 1 refills | Status: DC
  Filled 2022-02-03: qty 120, 30d supply, fill #0

## 2022-02-03 MED ORDER — BUSPIRONE HCL 30 MG PO TABS
ORAL_TABLET | ORAL | 2 refills | Status: DC
  Filled 2022-02-03: qty 60, 30d supply, fill #0
  Filled 2022-03-10: qty 60, 30d supply, fill #1
  Filled 2022-04-06: qty 60, 30d supply, fill #2

## 2022-02-17 DIAGNOSIS — Z124 Encounter for screening for malignant neoplasm of cervix: Secondary | ICD-10-CM | POA: Diagnosis not present

## 2022-02-17 DIAGNOSIS — Z6841 Body Mass Index (BMI) 40.0 and over, adult: Secondary | ICD-10-CM | POA: Diagnosis not present

## 2022-02-17 DIAGNOSIS — Z01419 Encounter for gynecological examination (general) (routine) without abnormal findings: Secondary | ICD-10-CM | POA: Diagnosis not present

## 2022-02-24 DIAGNOSIS — F411 Generalized anxiety disorder: Secondary | ICD-10-CM | POA: Diagnosis not present

## 2022-02-24 DIAGNOSIS — F4323 Adjustment disorder with mixed anxiety and depressed mood: Secondary | ICD-10-CM | POA: Diagnosis not present

## 2022-02-24 DIAGNOSIS — F331 Major depressive disorder, recurrent, moderate: Secondary | ICD-10-CM | POA: Diagnosis not present

## 2022-02-25 ENCOUNTER — Other Ambulatory Visit (HOSPITAL_COMMUNITY): Payer: Self-pay

## 2022-02-25 DIAGNOSIS — F4323 Adjustment disorder with mixed anxiety and depressed mood: Secondary | ICD-10-CM | POA: Diagnosis not present

## 2022-02-25 DIAGNOSIS — F411 Generalized anxiety disorder: Secondary | ICD-10-CM | POA: Diagnosis not present

## 2022-02-25 DIAGNOSIS — F331 Major depressive disorder, recurrent, moderate: Secondary | ICD-10-CM | POA: Diagnosis not present

## 2022-02-25 MED ORDER — LAMOTRIGINE 25 MG PO TABS
ORAL_TABLET | ORAL | 0 refills | Status: DC
  Filled 2022-02-25 – 2022-03-10 (×2): qty 45, 30d supply, fill #0

## 2022-02-27 ENCOUNTER — Other Ambulatory Visit (HOSPITAL_COMMUNITY): Payer: Self-pay

## 2022-03-07 ENCOUNTER — Other Ambulatory Visit (HOSPITAL_COMMUNITY): Payer: Self-pay

## 2022-03-10 ENCOUNTER — Other Ambulatory Visit (HOSPITAL_COMMUNITY): Payer: Self-pay

## 2022-03-10 DIAGNOSIS — F331 Major depressive disorder, recurrent, moderate: Secondary | ICD-10-CM | POA: Diagnosis not present

## 2022-03-10 DIAGNOSIS — F411 Generalized anxiety disorder: Secondary | ICD-10-CM | POA: Diagnosis not present

## 2022-03-10 DIAGNOSIS — F4323 Adjustment disorder with mixed anxiety and depressed mood: Secondary | ICD-10-CM | POA: Diagnosis not present

## 2022-03-11 ENCOUNTER — Other Ambulatory Visit (HOSPITAL_COMMUNITY): Payer: Self-pay

## 2022-03-23 ENCOUNTER — Encounter: Payer: Self-pay | Admitting: *Deleted

## 2022-03-23 ENCOUNTER — Other Ambulatory Visit (HOSPITAL_COMMUNITY): Payer: Self-pay

## 2022-03-24 DIAGNOSIS — F331 Major depressive disorder, recurrent, moderate: Secondary | ICD-10-CM | POA: Diagnosis not present

## 2022-03-24 DIAGNOSIS — F4323 Adjustment disorder with mixed anxiety and depressed mood: Secondary | ICD-10-CM | POA: Diagnosis not present

## 2022-03-24 DIAGNOSIS — F411 Generalized anxiety disorder: Secondary | ICD-10-CM | POA: Diagnosis not present

## 2022-03-25 DIAGNOSIS — F411 Generalized anxiety disorder: Secondary | ICD-10-CM | POA: Diagnosis not present

## 2022-03-25 DIAGNOSIS — F331 Major depressive disorder, recurrent, moderate: Secondary | ICD-10-CM | POA: Diagnosis not present

## 2022-03-25 DIAGNOSIS — F4323 Adjustment disorder with mixed anxiety and depressed mood: Secondary | ICD-10-CM | POA: Diagnosis not present

## 2022-04-06 ENCOUNTER — Other Ambulatory Visit (HOSPITAL_COMMUNITY): Payer: Self-pay

## 2022-04-09 ENCOUNTER — Other Ambulatory Visit (HOSPITAL_COMMUNITY): Payer: Self-pay

## 2022-04-09 MED ORDER — LAMOTRIGINE 25 MG PO TABS
ORAL_TABLET | ORAL | 0 refills | Status: DC
  Filled 2022-04-09: qty 75, 21d supply, fill #0

## 2022-04-10 ENCOUNTER — Other Ambulatory Visit (HOSPITAL_COMMUNITY): Payer: Self-pay

## 2022-04-14 DIAGNOSIS — F4323 Adjustment disorder with mixed anxiety and depressed mood: Secondary | ICD-10-CM | POA: Diagnosis not present

## 2022-04-14 DIAGNOSIS — F331 Major depressive disorder, recurrent, moderate: Secondary | ICD-10-CM | POA: Diagnosis not present

## 2022-04-14 DIAGNOSIS — F411 Generalized anxiety disorder: Secondary | ICD-10-CM | POA: Diagnosis not present

## 2022-04-16 ENCOUNTER — Other Ambulatory Visit (HOSPITAL_COMMUNITY): Payer: Self-pay

## 2022-04-16 DIAGNOSIS — F331 Major depressive disorder, recurrent, moderate: Secondary | ICD-10-CM | POA: Diagnosis not present

## 2022-04-16 DIAGNOSIS — F4323 Adjustment disorder with mixed anxiety and depressed mood: Secondary | ICD-10-CM | POA: Diagnosis not present

## 2022-04-16 DIAGNOSIS — F411 Generalized anxiety disorder: Secondary | ICD-10-CM | POA: Diagnosis not present

## 2022-04-16 MED ORDER — CLONAZEPAM 0.5 MG PO TABS
0.5000 mg | ORAL_TABLET | Freq: Two times a day (BID) | ORAL | 0 refills | Status: DC | PRN
  Filled 2022-04-16: qty 30, 15d supply, fill #0

## 2022-04-17 ENCOUNTER — Other Ambulatory Visit (HOSPITAL_COMMUNITY): Payer: Self-pay

## 2022-05-10 ENCOUNTER — Other Ambulatory Visit (HOSPITAL_COMMUNITY): Payer: Self-pay

## 2022-05-11 ENCOUNTER — Other Ambulatory Visit (HOSPITAL_COMMUNITY): Payer: Self-pay

## 2022-05-11 MED ORDER — LAMOTRIGINE 25 MG PO TABS
75.0000 mg | ORAL_TABLET | Freq: Every evening | ORAL | 1 refills | Status: DC
  Filled 2022-05-11: qty 90, 30d supply, fill #0

## 2022-05-11 MED ORDER — BUSPIRONE HCL 30 MG PO TABS
ORAL_TABLET | ORAL | 1 refills | Status: DC
  Filled 2022-05-11: qty 60, 30d supply, fill #0
  Filled 2022-06-07: qty 60, 30d supply, fill #1

## 2022-05-18 ENCOUNTER — Other Ambulatory Visit (HOSPITAL_COMMUNITY): Payer: Self-pay

## 2022-05-28 ENCOUNTER — Other Ambulatory Visit (HOSPITAL_COMMUNITY): Payer: Self-pay

## 2022-05-28 DIAGNOSIS — F411 Generalized anxiety disorder: Secondary | ICD-10-CM | POA: Diagnosis not present

## 2022-05-28 DIAGNOSIS — F4320 Adjustment disorder, unspecified: Secondary | ICD-10-CM | POA: Diagnosis not present

## 2022-05-28 DIAGNOSIS — F331 Major depressive disorder, recurrent, moderate: Secondary | ICD-10-CM | POA: Diagnosis not present

## 2022-05-28 MED ORDER — CLONAZEPAM 0.5 MG PO TABS
0.5000 mg | ORAL_TABLET | Freq: Two times a day (BID) | ORAL | 0 refills | Status: DC | PRN
  Filled 2022-05-28: qty 30, 15d supply, fill #0

## 2022-05-28 MED ORDER — LAMOTRIGINE 25 MG PO TABS
100.0000 mg | ORAL_TABLET | Freq: Every evening | ORAL | 1 refills | Status: DC
  Filled 2022-05-28 – 2022-06-07 (×2): qty 120, 30d supply, fill #0
  Filled 2022-07-12: qty 120, 30d supply, fill #1

## 2022-05-28 MED ORDER — BUSPIRONE HCL 30 MG PO TABS
ORAL_TABLET | ORAL | 1 refills | Status: DC
  Filled 2022-05-28: qty 45, 30d supply, fill #0
  Filled 2022-07-12: qty 60, 30d supply, fill #0
  Filled 2022-08-10: qty 60, 30d supply, fill #1

## 2022-05-28 MED ORDER — DULOXETINE HCL 60 MG PO CPEP
60.0000 mg | ORAL_CAPSULE | ORAL | 3 refills | Status: DC
  Filled 2022-05-28 – 2022-06-07 (×2): qty 30, 30d supply, fill #0
  Filled 2022-07-12: qty 30, 30d supply, fill #1
  Filled 2022-08-10: qty 30, 30d supply, fill #2
  Filled 2022-09-07: qty 30, 30d supply, fill #3

## 2022-06-08 ENCOUNTER — Other Ambulatory Visit (HOSPITAL_COMMUNITY): Payer: Self-pay

## 2022-06-11 ENCOUNTER — Encounter: Payer: Self-pay | Admitting: *Deleted

## 2022-06-11 ENCOUNTER — Other Ambulatory Visit (HOSPITAL_COMMUNITY): Payer: Self-pay

## 2022-06-21 ENCOUNTER — Telehealth: Payer: 59 | Admitting: Nurse Practitioner

## 2022-06-21 DIAGNOSIS — U071 COVID-19: Secondary | ICD-10-CM | POA: Diagnosis not present

## 2022-06-21 MED ORDER — MOLNUPIRAVIR EUA 200MG CAPSULE: 4.0000 | ORAL_CAPSULE | Freq: Two times a day (BID) | ORAL | 0 refills | Status: AC

## 2022-06-21 NOTE — Progress Notes (Signed)
Virtual Visit Consent   Carolyn Gilmore, you are scheduled for a virtual visit with a [facility] provider today. Just as with appointments in the office, your consent must be obtained to participate. Your consent will be active for this visit and any virtual visit you may have with one of our providers in the next 365 days. If you have a MyChart account, a copy of this consent can be sent to you electronically.  As this is a virtual visit, video technology does not allow for your provider to perform a traditional examination. This may limit your provider's ability to fully assess your condition. If your provider identifies any concerns that need to be evaluated in person or the need to arrange testing (such as labs, EKG, etc.), we will make arrangements to do so. Although advances in technology are sophisticated, we cannot ensure that it will always work on either your end or our end. If the connection with a video visit is poor, the visit may have to be switched to a telephone visit. With either a video or telephone visit, we are not always able to ensure that we have a secure connection.  By engaging in this virtual visit, you consent to the provision of healthcare and authorize for your insurance to be billed (if applicable) for the services provided during this visit. Depending on your insurance coverage, you may receive a charge related to this service.  I need to obtain your verbal consent now. Are you willing to proceed with your visit today? Fiorella Hanahan has provided verbal consent on 06/21/2022 for a virtual visit (video or telephone). Gildardo Pounds, NP  Date: 06/21/2022 8:54 AM  Virtual Visit via Video Note   I, Gildardo Pounds, connected with  Carolyn Gilmore  ([id number], Feb 17, 1981) on 06/21/22 at  8:45 AM EST by a video-enabled telemedicine application and verified that I am speaking with the correct person using two identifiers.  Location: Patient: Virtual Visit Location Patient:  Home Provider: Virtual Visit Location Provider: Home Office   I discussed the limitations of evaluation and management by telemedicine and the availability of in person appointments. The patient expressed understanding and agreed to proceed.    History of Present Illness: Carolyn Gilmore is a 41 y.o. who identifies as a female who was assigned female at birth, and is being seen today for [place].  [name] tested positive for COVID this morning.  States her husband tested positive last Tuesday.  Symptoms onset was over the past 24 hours and she is currently experiencing cough, runny nose, body aches and chills.  She does not endorse  shortness of breath or chest pain  Problems:  Patient Active Problem List   Diagnosis Date Noted   Chronic rhinitis 12/15/2021   Effusion, left knee 12/22/2018   Melanoma ([place])    IBS (irritable bowel syndrome)    Depression    Anxiety    Melanoma of thigh, right ([place]) 06/29/2014   Vitamin D deficiency 06/29/2013   Vitamin B 12 deficiency 06/29/2013    Allergies:  Allergies  Allergen Reactions   Bacitracin Other (See Comments)    Blisters and pustules   Medications:  Current Outpatient Medications:    molnupiravir EUA (LAGEVRIO) 200 mg CAPS capsule, Take 4 capsules (800 mg total) by mouth 2 (two) times daily for 5 days., Disp: 40 capsule, Rfl: 0   busPIRone (BUSPAR) 10 MG tablet, Take 1 tablet by mouth 3 times a day, Disp: 90 tablet, Rfl: 1  busPIRone (BUSPAR) 15 MG tablet, Take 1 tablet (15 mg total) by mouth 3 (three) times daily., Disp: 90 tablet, Rfl: 2   busPIRone (BUSPAR) 30 MG tablet, Take 1/2 tablet by mouth 3 times daily and 1/3 of a tablet at bedtime, Disp: 60 tablet, Rfl: 1   busPIRone (BUSPAR) 30 MG tablet, Take 1/2 tablet (15 mg total) by mouth 3 (three) times daily and 1/3 tablet (10 mg total) at bedtime., Disp: 60 tablet, Rfl: 1   Calcium Carbonate (CALCIUM 500 PO), Take 1 tablet by mouth daily., Disp: , Rfl:    clonazePAM  (KLONOPIN) 0.5 MG tablet, Take 0.5 mg by mouth daily as needed for anxiety (2 tablets daily)., Disp: , Rfl:    clonazePAM (KLONOPIN) 0.5 MG tablet, Take one tablet by mouth 2 times daily as needed for anxiety., Disp: 60 tablet, Rfl: 0   clonazePAM (KLONOPIN) 0.5 MG tablet, Take 1 tablet (0.5 mg total) by mouth 2 (two) times daily as needed for anxiety., Disp: 30 tablet, Rfl: 0   DULoxetine (CYMBALTA) 30 MG capsule, Take 1 capsule by mouth at 5pm, Disp: 30 capsule, Rfl: 2   DULoxetine (CYMBALTA) 30 MG capsule, Take 1 capsule by mouth daily at 5pm, Disp: 30 capsule, Rfl: 2   DULoxetine (CYMBALTA) 60 MG capsule, Take 1 capsule (60 mg total) by mouth every morning., Disp: 30 capsule, Rfl: 3   fluticasone (FLONASE) 50 MCG/ACT nasal spray, Place 2 sprays into both nostrils daily., Disp: 16 g, Rfl: 6   hydrOXYzine (ATARAX) 10 MG tablet, Take 2 tablets (20 mg total) by mouth 2 (two) times daily as needed for anxiety, Disp: 120 tablet, Rfl: 1   hydrOXYzine (ATARAX) 10 MG tablet, Take 2 tablets by mouth twice daily as needed for anxiety, Disp: 120 tablet, Rfl: 1   hydrOXYzine (ATARAX/VISTARIL) 10 MG tablet, Take 2 tablets by mouth 2 times a day as needed for anxiety, Disp: 120 tablet, Rfl: 1   lamoTRIgine (LAMICTAL) 25 MG tablet, Take 2 tablets (50 mg total) by mouth Nightly for 14 days, THEN 3 tablets (75 mg total) daily for 7 days., Disp: 75 tablet, Rfl: 0   lamoTRIgine (LAMICTAL) 25 MG tablet, Take 3 tablets (75 mg total) by mouth at bedtime., Disp: 90 tablet, Rfl: 1   lamoTRIgine (LAMICTAL) 25 MG tablet, Take 4 tablets (100 mg total) by mouth at bedtime., Disp: 120 tablet, Rfl: 1   levonorgestrel (KYLEENA) 19.5 MG IUD, by Intrauterine route. Placed July 2018, needs to be removed 2023., Disp: , Rfl:    Multiple Vitamin (MULTIVITAMIN) tablet, Take 1 tablet by mouth daily., Disp: , Rfl:    ofloxacin (OCUFLOX) 0.3 % ophthalmic solution, Instill 1-2 drops in affected eye(s) every 2 to 4 hours for 2 days, then  1-2 drops 4 times daily on days 3-7, Disp: 5 mL, Rfl: 0   ondansetron (ZOFRAN) 4 MG tablet, Take 1 tablet by mouth daily as needed for nausea, Disp: 30 tablet, Rfl: 2   propranolol (INDERAL) 10 MG tablet, Take one tablet by mouth daily as needed for anxiety., Disp: 30 tablet, Rfl: 0   VITAMIN D, CHOLECALCIFEROL, PO, Take 1,000 Units by mouth daily., Disp: , Rfl:   Observations/Objective: Patient is well-developed, well-nourished in no acute distress.  Resting comfortably  at home.  Head is normocephalic, atraumatic.  No labored breathing.  Speech is clear and coherent with logical content.  Patient is alert and oriented at baseline.    Assessment and Plan: 1. Positive self-administered antigen test for COVID-19 -  molnupiravir EUA (LAGEVRIO) 200 mg CAPS capsule; Take 4 capsules (800 mg total) by mouth 2 (two) times daily for 5 days.  Dispense: 40 capsule; Refill: 0  Please keep well-hydrated and get plenty of rest. Start a saline nasal rinse to flush out your nasal passages. You can use plain Mucinex to help thin congestion. If you have a humidifier, you can use this daily as needed.    You are to wear a mask for 5 days from onset of your symptoms.  After day 5, if you have had no fever and you are feeling better with NO symptoms, you can end masking. Keep in mind you can be contagious 10 days from the onset of symptoms  After day 5 if you have a fever or are having significant symptoms, please wear your mask for full 10 days.   If you note any worsening of symptoms, any significant shortness of breath or any chest pain, please seek ER evaluation ASAP.  Please do not delay care!    If you note any worsening of symptoms, any significant shortness of breath or any chest pain, please seek ER evaluation ASAP.  Please do not delay care!   Follow Up Instructions: I discussed the assessment and treatment plan with the patient. The patient was provided an opportunity to ask questions and all  were answered. The patient agreed with the plan and demonstrated an understanding of the instructions.  A copy of instructions were sent to the patient via MyChart unless otherwise noted below.   The patient was advised to call back or seek an in-person evaluation if the symptoms worsen or if the condition fails to improve as anticipated.  Time:  I spent 12 minutes with the patient via telehealth technology discussing the above problems/concerns.    Gildardo Pounds, NP

## 2022-06-21 NOTE — Patient Instructions (Signed)
Carolyn Gilmore, thank you for joining Gildardo Pounds, NP for today's virtual visit.  While this provider is not your primary care provider (PCP), if your PCP is located in our provider database this encounter information will be shared with them immediately following your visit.   [facility] account gives you access to today's visit and all your visits, tests, and labs performed at [facility] " click here if you don't have a [facility] account or go to [email or web address]  Consent: (Patient) Ayse Mccartin provided verbal consent for this virtual visit at the beginning of the encounter.  Current Medications:  Current Outpatient Medications:    molnupiravir EUA (LAGEVRIO) 200 mg CAPS capsule, Take 4 capsules (800 mg total) by mouth 2 (two) times daily for 5 days., Disp: 40 capsule, Rfl: 0   busPIRone (BUSPAR) 10 MG tablet, Take 1 tablet by mouth 3 times a day, Disp: 90 tablet, Rfl: 1   busPIRone (BUSPAR) 15 MG tablet, Take 1 tablet (15 mg total) by mouth 3 (three) times daily., Disp: 90 tablet, Rfl: 2   busPIRone (BUSPAR) 30 MG tablet, Take 1/2 tablet by mouth 3 times daily and 1/3 of a tablet at bedtime, Disp: 60 tablet, Rfl: 1   busPIRone (BUSPAR) 30 MG tablet, Take 1/2 tablet (15 mg total) by mouth 3 (three) times daily and 1/3 tablet (10 mg total) at bedtime., Disp: 60 tablet, Rfl: 1   Calcium Carbonate (CALCIUM 500 PO), Take 1 tablet by mouth daily., Disp: , Rfl:    clonazePAM (KLONOPIN) 0.5 MG tablet, Take 0.5 mg by mouth daily as needed for anxiety (2 tablets daily)., Disp: , Rfl:    clonazePAM (KLONOPIN) 0.5 MG tablet, Take one tablet by mouth 2 times daily as needed for anxiety., Disp: 60 tablet, Rfl: 0   clonazePAM (KLONOPIN) 0.5 MG tablet, Take 1 tablet (0.5 mg total) by mouth 2 (two) times daily as needed for anxiety., Disp: 30 tablet, Rfl: 0   DULoxetine (CYMBALTA) 30 MG capsule, Take 1 capsule by mouth at 5pm, Disp: 30 capsule, Rfl: 2   DULoxetine  (CYMBALTA) 30 MG capsule, Take 1 capsule by mouth daily at 5pm, Disp: 30 capsule, Rfl: 2   DULoxetine (CYMBALTA) 60 MG capsule, Take 1 capsule (60 mg total) by mouth every morning., Disp: 30 capsule, Rfl: 3   fluticasone (FLONASE) 50 MCG/ACT nasal spray, Place 2 sprays into both nostrils daily., Disp: 16 g, Rfl: 6   hydrOXYzine (ATARAX) 10 MG tablet, Take 2 tablets (20 mg total) by mouth 2 (two) times daily as needed for anxiety, Disp: 120 tablet, Rfl: 1   hydrOXYzine (ATARAX) 10 MG tablet, Take 2 tablets by mouth twice daily as needed for anxiety, Disp: 120 tablet, Rfl: 1   hydrOXYzine (ATARAX/VISTARIL) 10 MG tablet, Take 2 tablets by mouth 2 times a day as needed for anxiety, Disp: 120 tablet, Rfl: 1   lamoTRIgine (LAMICTAL) 25 MG tablet, Take 2 tablets (50 mg total) by mouth Nightly for 14 days, THEN 3 tablets (75 mg total) daily for 7 days., Disp: 75 tablet, Rfl: 0   lamoTRIgine (LAMICTAL) 25 MG tablet, Take 3 tablets (75 mg total) by mouth at bedtime., Disp: 90 tablet, Rfl: 1   lamoTRIgine (LAMICTAL) 25 MG tablet, Take 4 tablets (100 mg total) by mouth at bedtime., Disp: 120 tablet, Rfl: 1   levonorgestrel (KYLEENA) 19.5 MG IUD, by Intrauterine route. Placed July 2018, needs to be removed 2023., Disp: , Rfl:    Multiple Vitamin (  MULTIVITAMIN) tablet, Take 1 tablet by mouth daily., Disp: , Rfl:    ofloxacin (OCUFLOX) 0.3 % ophthalmic solution, Instill 1-2 drops in affected eye(s) every 2 to 4 hours for 2 days, then 1-2 drops 4 times daily on days 3-7, Disp: 5 mL, Rfl: 0   ondansetron (ZOFRAN) 4 MG tablet, Take 1 tablet by mouth daily as needed for nausea, Disp: 30 tablet, Rfl: 2   propranolol (INDERAL) 10 MG tablet, Take one tablet by mouth daily as needed for anxiety., Disp: 30 tablet, Rfl: 0   VITAMIN D, CHOLECALCIFEROL, PO, Take 1,000 Units by mouth daily., Disp: , Rfl:    Medications ordered in this encounter:  Meds ordered this encounter  Medications   molnupiravir EUA (LAGEVRIO) 200 mg  CAPS capsule    Sig: Take 4 capsules (800 mg total) by mouth 2 (two) times daily for 5 days.    Dispense:  40 capsule    Refill:  0    Order Specific Question:   Supervising Provider    Answer:   Chase Picket [id number]     *If you need refills on other medications prior to your next appointment, please contact your pharmacy*  Follow-Up: Call back or seek an in-person evaluation if the symptoms worsen or if the condition fails to improve as anticipated.  [facility] [phone]  Other Instructions  Please keep well-hydrated and get plenty of rest. Start a saline nasal rinse to flush out your nasal passages. You can use plain Mucinex to help thin congestion. If you have a humidifier, you can use this daily as needed.    You are to wear a mask for 5 days from onset of your symptoms.  After day 5, if you have had no fever and you are feeling better with NO symptoms, you can end masking. Keep in mind you can be contagious 10 days from the onset of symptoms  After day 5 if you have a fever or are having significant symptoms, please wear your mask for full 10 days.   If you note any worsening of symptoms, any significant shortness of breath or any chest pain, please seek ER evaluation ASAP.  Please do not delay care!    If you note any worsening of symptoms, any significant shortness of breath or any chest pain, please seek ER evaluation ASAP.  Please do not delay care!    If you have been instructed to have an in-person evaluation today at a local Urgent Care facility, please use the link below. It will take you to a list of all of our available [facility], including address, phone number and hours of operation. Please do not delay care.  [facility]  If you or a family member do not have a primary care provider, use the link below to schedule a visit and establish care. When you choose a [facility] primary care physician or advanced  practice provider, you gain a long-term partner in health. Find a Primary Care Provider  Learn more about [facility] in-office and virtual care options: [facility]

## 2022-07-13 ENCOUNTER — Other Ambulatory Visit (HOSPITAL_COMMUNITY): Payer: Self-pay

## 2022-07-13 ENCOUNTER — Other Ambulatory Visit: Payer: Self-pay

## 2022-08-10 ENCOUNTER — Other Ambulatory Visit (HOSPITAL_COMMUNITY): Payer: Self-pay

## 2022-08-11 ENCOUNTER — Other Ambulatory Visit: Payer: Self-pay

## 2022-08-27 ENCOUNTER — Other Ambulatory Visit (HOSPITAL_COMMUNITY): Payer: Self-pay

## 2022-08-27 DIAGNOSIS — F411 Generalized anxiety disorder: Secondary | ICD-10-CM | POA: Diagnosis not present

## 2022-08-27 DIAGNOSIS — F331 Major depressive disorder, recurrent, moderate: Secondary | ICD-10-CM | POA: Diagnosis not present

## 2022-08-27 DIAGNOSIS — F4323 Adjustment disorder with mixed anxiety and depressed mood: Secondary | ICD-10-CM | POA: Diagnosis not present

## 2022-08-27 MED ORDER — LAMOTRIGINE 100 MG PO TABS
100.0000 mg | ORAL_TABLET | Freq: Two times a day (BID) | ORAL | 0 refills | Status: DC
  Filled 2022-08-27: qty 60, 30d supply, fill #0

## 2022-08-27 MED ORDER — HYDROXYZINE HCL 10 MG PO TABS
20.0000 mg | ORAL_TABLET | Freq: Two times a day (BID) | ORAL | 1 refills | Status: DC | PRN
  Filled 2022-08-27: qty 120, 30d supply, fill #0

## 2022-08-27 MED ORDER — GABAPENTIN 100 MG PO CAPS
100.0000 mg | ORAL_CAPSULE | Freq: Three times a day (TID) | ORAL | 0 refills | Status: DC | PRN
  Filled 2022-08-27: qty 90, 30d supply, fill #0

## 2022-08-27 MED ORDER — DULOXETINE HCL 60 MG PO CPEP
60.0000 mg | ORAL_CAPSULE | Freq: Every morning | ORAL | 3 refills | Status: DC
  Filled 2022-08-27 – 2022-09-21 (×2): qty 30, 30d supply, fill #0

## 2022-08-27 MED ORDER — BUSPIRONE HCL 30 MG PO TABS
ORAL_TABLET | ORAL | 1 refills | Status: DC
  Filled 2022-08-27 – 2022-09-21 (×3): qty 60, 30d supply, fill #0

## 2022-08-27 MED ORDER — CLONAZEPAM 0.5 MG PO TABS
0.5000 mg | ORAL_TABLET | Freq: Two times a day (BID) | ORAL | 0 refills | Status: AC | PRN
  Filled 2022-08-27: qty 30, 15d supply, fill #0

## 2022-08-31 ENCOUNTER — Other Ambulatory Visit (HOSPITAL_COMMUNITY): Payer: Self-pay

## 2022-09-16 ENCOUNTER — Other Ambulatory Visit (HOSPITAL_COMMUNITY): Payer: Self-pay

## 2022-09-16 DIAGNOSIS — F4323 Adjustment disorder with mixed anxiety and depressed mood: Secondary | ICD-10-CM | POA: Diagnosis not present

## 2022-09-16 DIAGNOSIS — F411 Generalized anxiety disorder: Secondary | ICD-10-CM | POA: Diagnosis not present

## 2022-09-16 DIAGNOSIS — F331 Major depressive disorder, recurrent, moderate: Secondary | ICD-10-CM | POA: Diagnosis not present

## 2022-09-16 MED ORDER — BUSPIRONE HCL 30 MG PO TABS
ORAL_TABLET | ORAL | 2 refills | Status: DC
  Filled 2022-09-16 – 2022-09-25 (×2): qty 180, 90d supply, fill #0
  Filled 2023-01-01: qty 180, 90d supply, fill #1

## 2022-09-16 MED ORDER — DULOXETINE HCL 60 MG PO CPEP
60.0000 mg | ORAL_CAPSULE | Freq: Every morning | ORAL | 1 refills | Status: DC
  Filled 2022-09-20 – 2022-09-25 (×2): qty 90, 90d supply, fill #0
  Filled 2023-01-01: qty 90, 90d supply, fill #1

## 2022-09-16 MED ORDER — LAMOTRIGINE 100 MG PO TABS
100.0000 mg | ORAL_TABLET | Freq: Two times a day (BID) | ORAL | 1 refills | Status: DC
  Filled 2022-09-20 – 2022-09-21 (×2): qty 180, 90d supply, fill #0
  Filled 2023-01-01: qty 180, 90d supply, fill #1

## 2022-09-20 ENCOUNTER — Other Ambulatory Visit: Payer: Self-pay

## 2022-09-21 ENCOUNTER — Other Ambulatory Visit (HOSPITAL_COMMUNITY): Payer: Self-pay

## 2022-09-22 ENCOUNTER — Other Ambulatory Visit: Payer: Self-pay

## 2022-09-25 ENCOUNTER — Other Ambulatory Visit (HOSPITAL_COMMUNITY): Payer: Self-pay

## 2022-09-26 ENCOUNTER — Other Ambulatory Visit (HOSPITAL_COMMUNITY): Payer: Self-pay

## 2022-09-26 ENCOUNTER — Other Ambulatory Visit: Payer: Self-pay

## 2022-09-28 ENCOUNTER — Other Ambulatory Visit (HOSPITAL_COMMUNITY): Payer: Self-pay

## 2022-09-30 ENCOUNTER — Other Ambulatory Visit (HOSPITAL_COMMUNITY): Payer: Self-pay

## 2022-11-04 ENCOUNTER — Encounter: Payer: Commercial Managed Care - PPO | Admitting: Physician Assistant

## 2022-11-09 ENCOUNTER — Encounter: Payer: Self-pay | Admitting: Physician Assistant

## 2022-11-09 ENCOUNTER — Ambulatory Visit (INDEPENDENT_AMBULATORY_CARE_PROVIDER_SITE_OTHER): Payer: 59 | Admitting: Physician Assistant

## 2022-11-09 VITALS — BP 133/92 | HR 99 | Temp 97.3°F | Ht 69.5 in | Wt 260.2 lb

## 2022-11-09 DIAGNOSIS — Z124 Encounter for screening for malignant neoplasm of cervix: Secondary | ICD-10-CM

## 2022-11-09 DIAGNOSIS — E669 Obesity, unspecified: Secondary | ICD-10-CM | POA: Diagnosis not present

## 2022-11-09 DIAGNOSIS — E559 Vitamin D deficiency, unspecified: Secondary | ICD-10-CM | POA: Diagnosis not present

## 2022-11-09 DIAGNOSIS — F419 Anxiety disorder, unspecified: Secondary | ICD-10-CM | POA: Diagnosis not present

## 2022-11-09 DIAGNOSIS — Z0000 Encounter for general adult medical examination without abnormal findings: Secondary | ICD-10-CM | POA: Diagnosis not present

## 2022-11-09 NOTE — Patient Instructions (Addendum)
It was great to see you! ? ?Please go to the lab for blood work.  ? ?Our office will call you with your results unless you have chosen to receive results via MyChart. ? ?If your blood work is normal we will follow-up each year for physicals and as scheduled for chronic medical problems. ? ?If anything is abnormal we will treat accordingly and get you in for a follow-up. ? ?Take care, ? ?Cleatis Fandrich ?  ? ? ?

## 2022-11-09 NOTE — Progress Notes (Signed)
Subjective:    Carolyn Gilmore is a 42 y.o. female and is here for a comprehensive physical exam.  HPI  Health Maintenance Due  Topic Date Due   PAP SMEAR-Modifier  05/05/2019    Acute Concerns: None  Chronic Issues: Generalized anxiety disorder She is experiencing generalized anxiety disorder on a regular basis and overall seems to be worsening with time She has significant anxiety related to travel and is having panic attacks when she is in confined spaces Her prescriber, Hazel Sams, is managing her medication  Lamictal 100 mg twice daily Buspar 15 mg three times daily Cymbalta 60 mg Gabapentin 100 mg   BP Readings from Last 3 Encounters:  11/09/22 (!) 133/92  12/15/21 120/78  03/14/21 114/80    Health Maintenance: Immunizations --up-to-date Colonoscopy -- n/a Mammogram --discussed, she is going to look into the mobile mammography PAP --overdue, I have placed referral for new gynecologist Bone Density -- n/a Diet --trying to work on better eating Exercise --trying to exercise as able  Sleep habits --overall stable Mood --see above  UTD with dentist? -Yes UTD with eye doctor? -Yes  Weight history: Wt Readings from Last 10 Encounters:  11/09/22 260 lb 3.2 oz (118 kg)  12/15/21 273 lb 4 oz (123.9 kg)  03/14/21 259 lb 6.1 oz (117.7 kg)  03/13/20 258 lb (117 kg)  02/14/20 266 lb 8 oz (120.9 kg)  12/16/18 251 lb 4 oz (114 kg)  02/04/18 226 lb 6.4 oz (102.7 kg)  09/07/17 232 lb 12.8 oz (105.6 kg)  03/17/17 242 lb 4.8 oz (109.9 kg)  03/17/17 230 lb (104.3 kg)   Body mass index is 37.87 kg/m. No LMP recorded. (Menstrual status: IUD).  Alcohol use:  reports current alcohol use.  Tobacco use:  Tobacco Use: Low Risk  (11/09/2022)   Patient History    Smoking Tobacco Use: Never    Smokeless Tobacco Use: Never    Passive Exposure: Not on file   Eligible for lung cancer screening? No     11/09/2022    2:56 PM  Depression screen PHQ 2/9  Decreased  Interest 1  Down, Depressed, Hopeless 1  PHQ - 2 Score 2  Altered sleeping 2  Tired, decreased energy 3  Change in appetite 2  Feeling bad or failure about yourself  0  Trouble concentrating 1  Moving slowly or fidgety/restless 0  Suicidal thoughts 0  PHQ-9 Score 10  Difficult doing work/chores Somewhat difficult     Other providers/specialists: Patient Care Team: Jarold Motto, [place] as PCP - General (Physician Assistant)    PMHx, SurgHx, SocialHx, Medications, and Allergies were reviewed in the Visit Navigator and updated as appropriate.   Past Medical History:  Diagnosis Date   Anxiety    Depression    Elevated liver enzymes 10/09/2016   RUQ ultrasound -- showed some gallstones   Genital warts    Gestational diabetes 2016   HPV in female    Hx of gallstones 10/2016   Hypertension    IBS (irritable bowel syndrome)    Melanoma (HCC)    R thigh   Melanoma of thigh, right (HCC) 2016   PCOS (polycystic ovarian syndrome)    Vitamin B 12 deficiency 2015   Vitamin D deficiency 2015     Past Surgical History:  Procedure Laterality Date   CHOLECYSTECTOMY N/A 03/19/2017   Procedure: LAPAROSCOPIC CHOLECYSTECTOMY;  Surgeon: Berna Bue, MD;  Location: [facility] OR;  Service: General;  Laterality: N/A;   MOLE REMOVAL  N/A 2016   Right thigh, Melanoma  in office   WISDOM TOOTH EXTRACTION       Family History  Problem Relation Age of Onset   Hypertension Mother    Atrial fibrillation Mother    Hypertension Father    Hyperlipidemia Father    Diabetes Brother     Social History   Tobacco Use   Smoking status: Never   Smokeless tobacco: Never  Vaping Use   Vaping Use: Never used  Substance Use Topics   Alcohol use: Yes    Comment: social; rare use   Drug use: No    Review of Systems:   Review of Systems  Constitutional:  Negative for chills, fever, malaise/fatigue and weight loss.  HENT:  Negative for hearing loss, sinus pain and sore throat.    Respiratory:  Negative for cough and hemoptysis.   Cardiovascular:  Negative for chest pain, palpitations, leg swelling and PND.  Gastrointestinal:  Negative for abdominal pain, constipation, diarrhea, heartburn, nausea and vomiting.  Genitourinary:  Negative for dysuria, frequency and urgency.  Musculoskeletal:  Negative for back pain, myalgias and neck pain.  Skin:  Negative for itching and rash.  Neurological:  Negative for dizziness, tingling, seizures and headaches.  Endo/Heme/Allergies:  Negative for polydipsia.  Psychiatric/Behavioral:  Negative for depression. The patient is not nervous/anxious.     Objective:   BP (!) 133/92   Pulse 99   Temp (!) 97.3 F (36.3 C) (Temporal)   Ht 5' 9.5" (1.765 m)   Wt 260 lb 3.2 oz (118 kg)   SpO2 100%   BMI 37.87 kg/m  Body mass index is 37.87 kg/m.   General Appearance:    Alert, cooperative, no distress, appears stated age  Head:    Normocephalic, without obvious abnormality, atraumatic  Eyes:    PERRL, conjunctiva/corneas clear, EOM's intact, fundi    benign, both eyes  Ears:    Normal TM's and external ear canals, both ears  Nose:   Nares normal, septum midline, mucosa normal, no drainage    or sinus tenderness  Throat:   Lips, mucosa, and tongue normal; teeth and gums normal  Neck:   Supple, symmetrical, trachea midline, no adenopathy;    thyroid:  no enlargement/tenderness/nodules; no carotid   bruit or JVD  Back:     Symmetric, no curvature, ROM normal, no CVA tenderness  Lungs:     Clear to auscultation bilaterally, respirations unlabored  Chest Wall:    No tenderness or deformity   Heart:    Regular rate and rhythm, S1 and S2 normal, no murmur, rub or gallop  Breast Exam:    Deferred   Abdomen:     Soft, non-tender, bowel sounds active all four quadrants,    no masses, no organomegaly  Genitalia:    Deferred   Extremities:   Extremities normal, atraumatic, no cyanosis or edema  Pulses:   2+ and symmetric all  extremities  Skin:   Skin color, texture, turgor normal, no rashes or lesions  Lymph nodes:   Cervical, supraclavicular, and axillary nodes normal  Neurologic:   CNII-XII intact, normal strength, sensation and reflexes    throughout    Assessment/Plan:   Routine physical examination Today patient counseled on age appropriate routine health concerns for screening and prevention, each reviewed and up to date or declined. Immunizations reviewed and up to date or declined. Labs ordered and reviewed. Risk factors for depression reviewed and negative. Hearing function and visual acuity are intact.  ADLs screened and addressed as needed. Functional ability and level of safety reviewed and appropriate. Education, counseling and referrals performed based on assessed risks today. Patient provided with a copy of personalized plan for preventive services.  Anxiety Uncontrolled Continue management per psychiatry I would like to plug her in with a new therapist and I have given her a recommendation Denies suicidal or homicidal ideation I discussed with patient that if they develop any SI, to tell someone immediately and seek medical attention. Follow-up with specialist as indicated  Obesity, unspecified classification, unspecified obesity type, unspecified whether serious comorbidity present Continue healthy lifestyle efforts If eligible due to A1c, she would like to trial GLP-1's as her current insurance does not pay for these for her obesity Will update A1c and provide recommendations accordingly  Vitamin D deficiency Update vitamin D and provide recommendations accordingly  Pap smear for cervical cancer screening New referral placed   Jarold Motto, PA-C [facility]

## 2022-11-10 LAB — CBC WITH DIFFERENTIAL/PLATELET
Basophils Absolute: 0.1 10*3/uL (ref 0.0–0.1)
Basophils Relative: 1.2 % (ref 0.0–3.0)
Eosinophils Absolute: 0.4 10*3/uL (ref 0.0–0.7)
Eosinophils Relative: 4.1 % (ref 0.0–5.0)
HCT: 41.2 % (ref 36.0–46.0)
Hemoglobin: 13.9 g/dL (ref 12.0–15.0)
Lymphocytes Relative: 17.7 % (ref 12.0–46.0)
Lymphs Abs: 1.8 10*3/uL (ref 0.7–4.0)
MCHC: 33.7 g/dL (ref 30.0–36.0)
MCV: 86.7 fl (ref 78.0–100.0)
Monocytes Absolute: 0.6 10*3/uL (ref 0.1–1.0)
Monocytes Relative: 5.8 % (ref 3.0–12.0)
Neutro Abs: 7.3 10*3/uL (ref 1.4–7.7)
Neutrophils Relative %: 71.2 % (ref 43.0–77.0)
Platelets: 389 10*3/uL (ref 150.0–400.0)
RBC: 4.76 Mil/uL (ref 3.87–5.11)
RDW: 12.7 % (ref 11.5–15.5)
WBC: 10.2 10*3/uL (ref 4.0–10.5)

## 2022-11-10 LAB — COMPREHENSIVE METABOLIC PANEL
ALT: 30 U/L (ref 0–35)
AST: 22 U/L (ref 0–37)
Albumin: 4.7 g/dL (ref 3.5–5.2)
Alkaline Phosphatase: 83 U/L (ref 39–117)
BUN: 13 mg/dL (ref 6–23)
CO2: 28 mEq/L (ref 19–32)
Calcium: 9.9 mg/dL (ref 8.4–10.5)
Chloride: 100 mEq/L (ref 96–112)
Creatinine, Ser: 0.72 mg/dL (ref 0.40–1.20)
GFR: 104.39 mL/min (ref >60.00)
Glucose, Bld: 101 mg/dL — ABNORMAL HIGH (ref 70–99)
Potassium: 4.2 mEq/L (ref 3.5–5.1)
Sodium: 139 mEq/L (ref 135–145)
Total Bilirubin: 0.5 mg/dL (ref 0.2–1.2)
Total Protein: 7.3 g/dL (ref 6.0–8.3)

## 2022-11-10 LAB — HEMOGLOBIN A1C: Hgb A1c MFr Bld: 6.1 % (ref 4.6–6.5)

## 2022-11-10 LAB — LIPID PANEL
Cholesterol: 190 mg/dL (ref 0–200)
HDL: 52.2 mg/dL (ref >39.00)
LDL Cholesterol: 124 mg/dL — ABNORMAL HIGH (ref 0–99)
NonHDL: 137.69
Total CHOL/HDL Ratio: 4
Triglycerides: 67 mg/dL (ref 0.0–149.0)
VLDL: 13.4 mg/dL (ref 0.0–40.0)

## 2022-11-10 LAB — VITAMIN D 25 HYDROXY (VIT D DEFICIENCY, FRACTURES): VITD: 49.87 ng/mL (ref 30.00–100.00)

## 2022-11-25 DIAGNOSIS — F411 Generalized anxiety disorder: Secondary | ICD-10-CM | POA: Diagnosis not present

## 2022-11-25 DIAGNOSIS — F4323 Adjustment disorder with mixed anxiety and depressed mood: Secondary | ICD-10-CM | POA: Diagnosis not present

## 2022-11-25 DIAGNOSIS — F331 Major depressive disorder, recurrent, moderate: Secondary | ICD-10-CM | POA: Diagnosis not present

## 2022-12-01 ENCOUNTER — Ambulatory Visit: Payer: Commercial Managed Care - PPO | Admitting: Dermatology

## 2023-01-01 ENCOUNTER — Other Ambulatory Visit (HOSPITAL_COMMUNITY): Payer: Self-pay

## 2023-01-21 ENCOUNTER — Encounter: Payer: Self-pay | Admitting: Dermatology

## 2023-01-21 ENCOUNTER — Ambulatory Visit: Payer: 59 | Admitting: Dermatology

## 2023-01-21 VITALS — BP 119/79 | HR 96

## 2023-01-21 DIAGNOSIS — Z85820 Personal history of malignant melanoma of skin: Secondary | ICD-10-CM

## 2023-01-21 DIAGNOSIS — D1801 Hemangioma of skin and subcutaneous tissue: Secondary | ICD-10-CM

## 2023-01-21 DIAGNOSIS — W908XXA Exposure to other nonionizing radiation, initial encounter: Secondary | ICD-10-CM

## 2023-01-21 DIAGNOSIS — L821 Other seborrheic keratosis: Secondary | ICD-10-CM

## 2023-01-21 DIAGNOSIS — L814 Other melanin hyperpigmentation: Secondary | ICD-10-CM

## 2023-01-21 DIAGNOSIS — D229 Melanocytic nevi, unspecified: Secondary | ICD-10-CM

## 2023-01-21 DIAGNOSIS — L578 Other skin changes due to chronic exposure to nonionizing radiation: Secondary | ICD-10-CM

## 2023-01-21 DIAGNOSIS — Z1283 Encounter for screening for malignant neoplasm of skin: Secondary | ICD-10-CM

## 2023-01-21 DIAGNOSIS — D224 Melanocytic nevi of scalp and neck: Secondary | ICD-10-CM

## 2023-01-21 DIAGNOSIS — Q828 Other specified congenital malformations of skin: Secondary | ICD-10-CM | POA: Diagnosis not present

## 2023-01-21 DIAGNOSIS — D485 Neoplasm of uncertain behavior of skin: Secondary | ICD-10-CM

## 2023-01-21 NOTE — Progress Notes (Signed)
New Patient Visit   Subjective  Carolyn Gilmore is a 42 y.o. female who presents for the following: Skin Cancer Screening and Full Body Skin Exam  The patient presents for Total-Body Skin Exam (TBSE) for skin cancer screening and mole check. The patient has spots, moles and lesions to be evaluated, some may be new or changing and the patient may have concern these could be cancer.  Patient states she has 2 moles located at the left forearm and right  jawline that she would like to have examined. Patient reports the areas have been there for a couple years, the spot on arm recently started changing shape and texture. She reports the area on the jaw itches and bleeds but the arm is not  bothersome. Patient reports neither have  previously been treated for these areas. Patient has Hx of skin cancer. Pt had a melanoma in 2016 on right back and right thigh.Patient has no  family history of skin cancer(s).   Pt does not wear sunscreen daily but does wear it when she goes out. Her last skin check was around 2018.    The following portions of the chart were reviewed this encounter and updated as appropriate: medications, allergies, medical history  Review of Systems:  No other skin or systemic complaints except as noted in HPI or Assessment and Plan.  Objective  Well appearing patient in no apparent distress; mood and affect are within normal limits.  A full examination was performed including scalp, head, eyes, ears, nose, lips, neck, chest, axillae, abdomen, back, buttocks, bilateral upper extremities, bilateral lower extremities, hands, feet, fingers, toes, fingernails, and toenails. All findings within normal limits unless otherwise noted below.   Relevant physical exam findings are noted in the Assessment and Plan.  Exam: right supbmental 3.5 mm dark brown papule       5mm pink fleshy papule on scalp  Numerous flat topped papules with collared scale   Assessment & Plan   SKIN  CANCER SCREENING PERFORMED TODAY.  ACTINIC DAMAGE - Chronic condition, secondary to cumulative UV/sun exposure - diffuse scaly erythematous macules with underlying dyspigmentation - Recommend daily broad spectrum sunscreen SPF 30+ to sun-exposed areas, reapply every 2 hours as needed.  - Staying in the shade or wearing long sleeves, sun glasses (UVA+UVB protection) and wide brim hats (4-inch brim around the entire circumference of the hat) are also recommended for sun protection.  - Call for new or changing lesions.  LENTIGINES, SEBORRHEIC KERATOSES, HEMANGIOMAS - Benign normal skin lesions - Benign-appearing - Call for any changes  MELANOCYTIC NEVI - Tan-brown and/or pink-flesh-colored symmetric macules and papules - Benign appearing on exam today - Observation - Call clinic for new or changing moles - Recommend daily use of broad spectrum spf 30+ sunscreen to sun-exposed areas.   Dermal Nevus  Porokeratosis   Samples of sunscreen given     Neoplasm of uncertain behavior of skin right supbmental  Skin / nail biopsy Type of biopsy: tangential   Informed consent: discussed and consent obtained   Timeout: patient name, date of birth, surgical site, and procedure verified   Procedure prep:  Patient was prepped and draped in usual sterile fashion Prep type:  Isopropyl alcohol Anesthesia: the lesion was anesthetized in a standard fashion   Anesthetic:  1% lidocaine w/ epinephrine 1-100,000 buffered w/ 8.4% NaHCO3 Instrument used: DermaBlade   Hemostasis achieved with: aluminum chloride   Outcome: patient tolerated procedure well   Post-procedure details: sterile dressing applied and wound  care instructions given   Dressing type: petrolatum gauze and bandage      Return in about 3 months (around 04/23/2023) for TBSE.  Owens Shark, CMA, am acting as scribe for [employer], DO.   Documentation: I have reviewed the above documentation for accuracy and completeness,  and I agree with the above.  Langston Reusing, DO

## 2023-01-21 NOTE — Patient Instructions (Addendum)
Hi Carolyn Gilmore,  Thank you for visiting my office today and for your proactive approach to your skin health. I appreciate your diligence in monitoring your skin, especially given your history with melanoma. Here is a summary of the key points from our consultation:  - Examination of Concerns: We examined the spot on your jawline, which is a 3.52mm dark brown papule. Given its symptoms of itching and bleeding, we proceeded with a shave biopsy under local anesthesia (lidocaine).  - Biopsy Care Instructions:   - Apply Vaseline or Aquaphor generously to the biopsy site.   - Cover the area with a Band-Aid.   - If you have acne-prone skin, be cautious with the use of Aquaphor.  - Lab Work: The biopsy sample has been sent to the laboratory for analysis. We will ensure the correct labels are on the samples.  - Follow-Up:   - You will receive a message through MyChart if the results are normal. If there are any suspicious findings, we will contact you directly to discuss the next steps.    - Please schedule a follow-up appointment in six months to monitor your skin condition. Depending on the outcome, we may adjust the frequency of future visits.  - Additional Support: If you wish to discuss acne skincare further, please let us know, and we can arrange a separate appointment to address these concerns.  Thank you once again for your visit today. Please follow the care instructions closely and do not hesitate to contact us if you have any questions or concerns in the meantime.  ~Dr. Onalee Hua    Patient Handout: Wound Care for Skin Biopsy Site  Taking Care of Your Skin Biopsy Site  Proper care of the biopsy site is essential for promoting healing and minimizing scarring. This handout provides instructions on how to care for your biopsy site to ensure optimal recovery.  1. Cleaning the Wound:  Clean the biopsy site daily with gentle soap and water. Gently pat the area dry with a clean, soft towel. Avoid  harsh scrubbing or rubbing the area, as this can irritate the skin and delay healing.  2. Applying Aquaphor and Bandage:  After cleaning the wound, apply a thin layer of Aquaphor ointment to the biopsy site. Cover the area with a sterile bandage to protect it from dirt, bacteria, and friction. Change the bandage daily or as needed if it becomes soiled or wet.  3. Continued Care for One Week:  Repeat the cleaning, Aquaphor application, and bandaging process daily for one week following the biopsy procedure. Keeping the wound clean and moist during this initial healing period will help prevent infection and promote optimal healing.  4. Massaging Aquaphor into the Area:  ---After one week, discontinue the use of bandages but continue to apply Aquaphor to the biopsy site. ----Gently massage the Aquaphor into the area using circular motions. ---Massaging the skin helps to promote circulation and prevent the formation of scar tissue.   Additional Tips:  Avoid exposing the biopsy site to direct sunlight during the healing process, as this can cause hyperpigmentation or worsen scarring. If you experience any signs of infection, such as increased redness, swelling, warmth, or drainage from the wound, contact your healthcare provider immediately. Follow any additional instructions provided by your healthcare provider for caring for the biopsy site and managing any discomfort. Conclusion:  Taking proper care of your skin biopsy site is crucial for ensuring optimal healing and minimizing scarring. By following these instructions for cleaning, applying Aquaphor,  and massaging the area, you can promote a smooth and successful recovery. If you have any questions or concerns about caring for your biopsy site, don't hesitate to contact your healthcare provider for guidance.    Due to recent changes in healthcare laws, you may see results of your pathology and/or laboratory studies on MyChart before the  doctors have had a chance to review them. We understand that in some cases there may be results that are confusing or concerning to you. Please understand that not all results are received at the same time and often the doctors may need to interpret multiple results in order to provide you with the best plan of care or course of treatment. Therefore, we ask that you please give [place] 2 business days to thoroughly review all your results before contacting the office for clarification. Should we see a critical lab result, you will be contacted sooner.   If You Need Anything After Your Visit  If you have any questions or concerns for your doctor, please call our main line at [phone] If no one answers, please leave a voicemail as directed and we will return your call as soon as possible. Messages left after 4 pm will be answered the following business day.   You may also send [place] a message via MyChart. We typically respond to MyChart messages within 1-2 business days.  For prescription refills, please ask your pharmacy to contact our office. Our fax number is [phone].  If you have an urgent issue when the clinic is closed that cannot wait until the next business day, you can page your doctor at the number below.    Please note that while we do our best to be available for urgent issues outside of office hours, we are not available 24/7.   If you have an urgent issue and are unable to reach [place], you may choose to seek medical care at your doctor's office, retail clinic, urgent care center, or emergency room.  If you have a medical emergency, please immediately call 911 or go to the emergency department. In the event of inclement weather, please call our main line at [phone] for an update on the status of any delays or closures.  Dermatology Medication Tips: Please keep the boxes that topical medications come in in order to help keep track of the instructions about where and how to use  these. Pharmacies typically print the medication instructions only on the boxes and not directly on the medication tubes.   If your medication is too expensive, please contact our office at [phone] or send [place] a message through MyChart.   We are unable to tell what your co-pay for medications will be in advance as this is different depending on your insurance coverage. However, we may be able to find a substitute medication at lower cost or fill out paperwork to get insurance to cover a needed medication.   If a prior authorization is required to get your medication covered by your insurance company, please allow [place] 1-2 business days to complete this process.  Drug prices often vary depending on where the prescription is filled and some pharmacies may offer cheaper prices.  The website www.goodrx.com contains coupons for medications through different pharmacies. The prices here do not account for what the cost may be with help from insurance (it may be cheaper with your insurance), but the website can give you the price if you did not use any insurance.  - You can print  the associated coupon and take it with your prescription to the pharmacy.  - You may also stop by our office during regular business hours and pick up a GoodRx coupon card.  - If you need your prescription sent electronically to a different pharmacy, notify our office through [facility] or by phone at [phone]

## 2023-02-04 NOTE — Progress Notes (Signed)
[facility]  Dr. Onalee Hua reviewed your biopsy results and they showed the spot removed was benign (not cancerous).  No additional treatment is required.  The detailed report is available to view in MyChart.  Have a great day!  Kind Regards,  Dr. Kermit Balo Care Team

## 2023-02-16 ENCOUNTER — Encounter: Payer: Self-pay | Admitting: Physician Assistant

## 2023-02-17 ENCOUNTER — Other Ambulatory Visit (HOSPITAL_COMMUNITY): Payer: Self-pay

## 2023-02-17 DIAGNOSIS — F4323 Adjustment disorder with mixed anxiety and depressed mood: Secondary | ICD-10-CM | POA: Diagnosis not present

## 2023-02-17 DIAGNOSIS — F411 Generalized anxiety disorder: Secondary | ICD-10-CM | POA: Diagnosis not present

## 2023-02-17 DIAGNOSIS — F331 Major depressive disorder, recurrent, moderate: Secondary | ICD-10-CM | POA: Diagnosis not present

## 2023-02-17 MED ORDER — HYDROXYZINE HCL 10 MG PO TABS
20.0000 mg | ORAL_TABLET | Freq: Two times a day (BID) | ORAL | 1 refills | Status: DC | PRN
  Filled 2023-02-17 – 2023-09-23 (×3): qty 120, 30d supply, fill #0

## 2023-02-17 MED ORDER — LAMOTRIGINE 100 MG PO TABS
100.0000 mg | ORAL_TABLET | Freq: Two times a day (BID) | ORAL | 1 refills | Status: DC
  Filled 2023-03-29: qty 180, 90d supply, fill #0
  Filled 2023-09-23 (×2): qty 180, 90d supply, fill #1

## 2023-02-17 MED ORDER — GABAPENTIN 100 MG PO CAPS
100.0000 mg | ORAL_CAPSULE | Freq: Every day | ORAL | 0 refills | Status: DC | PRN
  Filled 2023-02-17: qty 90, 90d supply, fill #0

## 2023-02-17 MED ORDER — DULOXETINE HCL 60 MG PO CPEP
60.0000 mg | ORAL_CAPSULE | Freq: Every morning | ORAL | 1 refills | Status: DC
  Filled 2023-03-29: qty 90, 90d supply, fill #0
  Filled 2023-09-23 (×2): qty 90, 90d supply, fill #1

## 2023-02-17 MED ORDER — BUSPIRONE HCL 30 MG PO TABS
15.0000 mg | ORAL_TABLET | Freq: Three times a day (TID) | ORAL | 1 refills | Status: DC
  Filled 2023-03-29: qty 135, 90d supply, fill #0
  Filled 2023-09-23 (×2): qty 135, 90d supply, fill #1

## 2023-02-19 ENCOUNTER — Encounter (HOSPITAL_COMMUNITY): Payer: Self-pay

## 2023-02-19 ENCOUNTER — Other Ambulatory Visit (HOSPITAL_COMMUNITY): Payer: Self-pay

## 2023-02-19 MED ORDER — ZEPBOUND 2.5 MG/0.5ML ~~LOC~~ SOAJ
2.5000 mg | SUBCUTANEOUS | 0 refills | Status: DC
  Filled 2023-02-19 (×2): qty 2, 28d supply, fill #0

## 2023-03-08 ENCOUNTER — Ambulatory Visit (INDEPENDENT_AMBULATORY_CARE_PROVIDER_SITE_OTHER): Payer: 59 | Admitting: Obstetrics and Gynecology

## 2023-03-08 ENCOUNTER — Other Ambulatory Visit (HOSPITAL_COMMUNITY)
Admission: RE | Admit: 2023-03-08 | Discharge: 2023-03-08 | Disposition: A | Discharge status: Home / Self Care | Payer: 59 | Source: Ambulatory Visit | Attending: Obstetrics and Gynecology | Admitting: Obstetrics and Gynecology

## 2023-03-08 ENCOUNTER — Encounter: Payer: Self-pay | Admitting: Obstetrics and Gynecology

## 2023-03-08 VITALS — BP 112/70 | HR 93 | Ht 67.75 in | Wt 258.0 lb

## 2023-03-08 DIAGNOSIS — Z975 Presence of (intrauterine) contraceptive device: Secondary | ICD-10-CM | POA: Insufficient documentation

## 2023-03-08 DIAGNOSIS — Z30430 Encounter for insertion of intrauterine contraceptive device: Secondary | ICD-10-CM

## 2023-03-08 DIAGNOSIS — Z01419 Encounter for gynecological examination (general) (routine) without abnormal findings: Secondary | ICD-10-CM | POA: Diagnosis not present

## 2023-03-08 DIAGNOSIS — Z1231 Encounter for screening mammogram for malignant neoplasm of breast: Secondary | ICD-10-CM

## 2023-03-08 MED ORDER — LEVONORGESTREL 20 MCG/DAY IU IUD
1.0000 | INTRAUTERINE_SYSTEM | Freq: Once | INTRAUTERINE | Status: AC
Start: 2023-03-08 — End: ?

## 2023-03-08 NOTE — Addendum Note (Signed)
Addended by: Earley Favor on: 03/08/2023 03:35 PM   Modules accepted: Orders

## 2023-03-08 NOTE — Progress Notes (Addendum)
42 y.o. y.o. female here for annual exam.  Patient with the mirena IUD placed on Mirena IUD placed 01/01/2017.  She has occasional spotting with this and is pleased with this.  Pelvic discharge: denies Pelvic pain: denies  Blood pressure 112/70, pulse 93, height 5' 7.75" (1.721 m), weight 258 lb (117 kg), SpO2 98%.  No results found for: "DIAGPAP", "HPVHIGH", "ADEQPAP"  GYN HISTORY: No results found for: "DIAGPAP", "HPVHIGH", "ADEQPAP" Denies any abnormal pap smears  OB History  Gravida Para Term Preterm AB Living  1 1 1     1   SAB IAB Ectopic Multiple Live Births          1    # Outcome Date GA Lbr Len/2nd Weight Sex Type Anes PTL Lv  1 Term             Past Medical History:  Diagnosis Date   Anxiety    Depression    Elevated liver enzymes 10/09/2016   RUQ ultrasound -- showed some gallstones   Genital warts    Gestational diabetes 2016   HPV in female    Hx of gallstones 10/2016   Hypertension    IBS (irritable bowel syndrome)    Melanoma (HCC)    R thigh   Melanoma of thigh, right (HCC) 2016   PCOS (polycystic ovarian syndrome)    Vitamin B 12 deficiency 2015   Vitamin D deficiency 2015    Past Surgical History:  Procedure Laterality Date   CHOLECYSTECTOMY N/A 03/19/2017   Procedure: LAPAROSCOPIC CHOLECYSTECTOMY;  Surgeon: Berna Bue, MD;  Location: [facility] OR;  Service: General;  Laterality: N/A;   MOLE REMOVAL N/A 2016   Right thigh, Melanoma  in office   WISDOM TOOTH EXTRACTION      Current Outpatient Medications on File Prior to Visit  Medication Sig Dispense Refill   busPIRone (BUSPAR) 30 MG tablet Take 0.5 tablets (15 mg total) by mouth 3 (three) times daily, max of 2 tablets daily. 180 tablet 1   Calcium Carbonate (CALCIUM 500 PO) Take 1 tablet by mouth daily.     clonazePAM (KLONOPIN) 0.5 MG tablet Take 1 tablet (0.5 mg total) by mouth 2 (two) times daily as needed for anxiety 30 tablet 0   DULoxetine (CYMBALTA) 60 MG capsule Take 1 capsule  (60 mg total) by mouth every morning. 90 capsule 1   gabapentin (NEURONTIN) 100 MG capsule Take 1 capsule (100 mg total) by mouth 3 (three) times daily as needed. 90 capsule 0   hydrOXYzine (ATARAX) 10 MG tablet Take 2 tablets (20 mg total) by mouth 2 (two) times daily as needed for anxiety 120 tablet 1   lamoTRIgine (LAMICTAL) 100 MG tablet Take 1 tablet (100 mg total) by mouth in the morning and at bedtime. 180 tablet 1   levonorgestrel (KYLEENA) 19.5 MG IUD by Intrauterine route. Placed July 2018, needs to be removed 2023.     Multiple Vitamin (MULTIVITAMIN) tablet Take 1 tablet by mouth daily.     ondansetron (ZOFRAN) 4 MG tablet Take 1 tablet by mouth daily as needed for nausea 30 tablet 2   tirzepatide (ZEPBOUND) 2.5 MG/0.5ML Pen Inject 2.5 mg into the skin once a week. 2 mL 0   VITAMIN D, CHOLECALCIFEROL, PO Take 1,000 Units by mouth daily.     busPIRone (BUSPAR) 30 MG tablet Take 1/2 tablet by mouth 3 times a day and 1/3 tablet at bedtime (max daily dose 2 tablets) (Patient not taking: Reported on  03/08/2023) 60 tablet 1   busPIRone (BUSPAR) 30 MG tablet Take 1/2 tab(15mg ) by mouth 3 times a day and 1/3 tab(10 mg) at bedtime. Max daily dose 2 (Patient not taking: Reported on 03/08/2023) 180 tablet 2   DULoxetine (CYMBALTA) 60 MG capsule Take 1 capsule (60 mg total) by mouth every morning. (Patient not taking: Reported on 03/08/2023) 30 capsule 3   DULoxetine (CYMBALTA) 60 MG capsule Take 1 capsule (60 mg total) by mouth every morning. (Patient not taking: Reported on 03/08/2023) 90 capsule 1   fluticasone (FLONASE) 50 MCG/ACT nasal spray Place 2 sprays into both nostrils daily. (Patient not taking: Reported on 03/08/2023) 16 g 6   gabapentin (NEURONTIN) 100 MG capsule Take 1 capsule (100 mg total) by mouth daily as needed for anxiety (Patient not taking: Reported on 03/08/2023) 90 capsule 0   hydrOXYzine (ATARAX/VISTARIL) 10 MG tablet Take 2 tablets by mouth 2 times a day as needed for anxiety (Patient  not taking: Reported on 03/08/2023) 120 tablet 1   No current facility-administered medications on file prior to visit.    Social History   Socioeconomic History   Marital status: Married    Spouse name: Not on file   Number of children: Not on file   Years of education: Not on file   Highest education level: Not on file  Occupational History   Not on file  Tobacco Use   Smoking status: Never   Smokeless tobacco: Never  Vaping Use   Vaping status: Never Used  Substance and Sexual Activity   Alcohol use: Yes    Comment: social   Drug use: No   Sexual activity: Yes    Partners: Male    Birth control/protection: I.U.D.  Other Topics Concern   Not on file  Social History Narrative   Was living in [place] in 15-16 years   Now moving here to be close to dad   Married with 1 son   Social Determinants of Health   Financial Resource Strain: Not on file  Food Insecurity: Not on file  Transportation Needs: Not on file  Physical Activity: Not on file  Stress: Not on file  Social Connections: Not on file  Intimate Partner Violence: Not on file    Family History  Problem Relation Age of Onset   Hypertension Mother    Atrial fibrillation Mother    Hypertension Father    Hyperlipidemia Father    Diabetes Brother      Allergies  Allergen Reactions   Bacitracin Other (See Comments)    Blisters and pustules      Patient's last menstrual period was No LMP recorded. (Menstrual status: IUD)..           Review of Systems Alls systems reviewed and are negative.     PE General appearance: alert, cooperative and appears stated age Head: Normocephalic, without obvious abnormality, atraumatic Neck: no adenopathy, supple, symmetrical, trachea midline and thyroid normal to inspection and palpation Lungs: clear to auscultation bilaterally Breasts: normal appearance, no masses or tenderness Heart: regular rate and rhythm Abdomen: soft, non-tender; bowel sounds normal; no masses,   no organomegaly Extremities: extremities normal, atraumatic, no cyanosis or edema Skin: Skin color, texture, turgor normal. No rashes or lesions Lymph nodes: Cervical, supraclavicular, and axillary nodes normal. No abnormal inguinal nodes palpated Neurologic: Grossly normal     Pelvic: External genitalia:  no lesions              Urethra:  normal appearing  urethra with no masses, tenderness or lesions              Bartholins and Skenes: normal                 Vagina: normal appearing vagina with normal color and discharge, no lesions.               Cervix: no lesions, no cervical motion tenderness, IUD STRINGS SEEN from EO               Bimanual Exam:  Uterus:  normal size, contour, position, consistency, mobility, non-tender              Adnexa: no mass, fullness, tenderness          Chaperone was present for exam.   A:         Well Woman GYN exam, mirena IUD in place                             P:        Pap smear collected             Encouraged annual mammogram screening            Can have the mirena now for up to 8 years, if she desires.             Labs and immunizations with her primary             Discussed breast self exams             Encouraged safe sexual practices and enouraged healthy lifestyle practices with diet and exercise  Earley Favor

## 2023-03-08 NOTE — Addendum Note (Signed)
Addended by: Earley Favor on: 03/08/2023 03:08 PM   Modules accepted: Orders

## 2023-03-16 ENCOUNTER — Encounter: Payer: Self-pay | Admitting: Physician Assistant

## 2023-03-17 ENCOUNTER — Other Ambulatory Visit (HOSPITAL_COMMUNITY): Payer: Self-pay

## 2023-03-17 MED ORDER — ZEPBOUND 5 MG/0.5ML ~~LOC~~ SOAJ
5.0000 mg | SUBCUTANEOUS | 0 refills | Status: DC
  Filled 2023-03-17: qty 2, 28d supply, fill #0

## 2023-03-18 ENCOUNTER — Other Ambulatory Visit (HOSPITAL_COMMUNITY): Payer: Self-pay

## 2023-03-19 LAB — CYTOLOGY - PAP: Diagnosis: NEGATIVE

## 2023-03-29 ENCOUNTER — Other Ambulatory Visit: Payer: Self-pay

## 2023-03-29 ENCOUNTER — Other Ambulatory Visit (HOSPITAL_COMMUNITY): Payer: Self-pay

## 2023-04-01 ENCOUNTER — Encounter: Payer: Self-pay | Admitting: Physician Assistant

## 2023-04-02 ENCOUNTER — Other Ambulatory Visit (HOSPITAL_COMMUNITY): Payer: Self-pay

## 2023-04-02 ENCOUNTER — Other Ambulatory Visit: Payer: Self-pay | Admitting: Physician Assistant

## 2023-04-02 MED ORDER — ZEPBOUND 7.5 MG/0.5ML ~~LOC~~ SOAJ
7.5000 mg | SUBCUTANEOUS | 0 refills | Status: DC
Start: 2023-04-02 — End: 2023-06-25
  Filled 2023-04-02: qty 2, 28d supply, fill #0

## 2023-04-02 MED ORDER — ZEPBOUND 10 MG/0.5ML ~~LOC~~ SOAJ
10.0000 mg | SUBCUTANEOUS | 0 refills | Status: DC
Start: 2023-05-02 — End: 2024-03-02
  Filled 2023-04-02 – 2023-09-25 (×5): qty 2, 28d supply, fill #0

## 2023-04-02 NOTE — Telephone Encounter (Signed)
Please advise if ok to increase dose and recommendations on pacing injections out.

## 2023-04-05 ENCOUNTER — Telehealth: Payer: Self-pay | Admitting: *Deleted

## 2023-04-05 NOTE — Telephone Encounter (Signed)
Spoke with patient.  Mirena IUD placed 01/01/2017. Patient was scheduled for IUD exchange on 10/8.  Patient request to cancel IUD exchange and  have OV notes updated to reflect Mirena IUD, not Kyleena. Patient will have records transferred from previous provider to update records. Patient aware Mirena IUD good for 8 years.   Dr. Karma Greaser -please update patients chart dated 03/08/23.

## 2023-04-06 ENCOUNTER — Ambulatory Visit: Payer: 59 | Admitting: Obstetrics and Gynecology

## 2023-04-19 ENCOUNTER — Encounter: Payer: Self-pay | Admitting: Physician Assistant

## 2023-04-19 ENCOUNTER — Other Ambulatory Visit (HOSPITAL_COMMUNITY): Payer: Self-pay

## 2023-04-19 MED ORDER — ZEPBOUND 5 MG/0.5ML ~~LOC~~ SOAJ
5.0000 mg | SUBCUTANEOUS | 0 refills | Status: DC
  Filled 2023-04-19: qty 2, 28d supply, fill #0

## 2023-05-12 ENCOUNTER — Other Ambulatory Visit (HOSPITAL_COMMUNITY): Payer: Self-pay

## 2023-05-12 DIAGNOSIS — F411 Generalized anxiety disorder: Secondary | ICD-10-CM | POA: Diagnosis not present

## 2023-05-12 DIAGNOSIS — F331 Major depressive disorder, recurrent, moderate: Secondary | ICD-10-CM | POA: Diagnosis not present

## 2023-05-12 DIAGNOSIS — F4323 Adjustment disorder with mixed anxiety and depressed mood: Secondary | ICD-10-CM | POA: Diagnosis not present

## 2023-05-12 MED ORDER — DULOXETINE HCL 60 MG PO CPEP
60.0000 mg | ORAL_CAPSULE | Freq: Every morning | ORAL | 1 refills | Status: AC
  Filled 2023-05-18 – 2023-06-25 (×2): qty 90, 90d supply, fill #0
  Filled 2023-09-23 – 2024-01-25 (×2): qty 90, 90d supply, fill #1

## 2023-05-12 MED ORDER — BUSPIRONE HCL 30 MG PO TABS
15.0000 mg | ORAL_TABLET | Freq: Three times a day (TID) | ORAL | 1 refills | Status: DC
  Filled 2023-05-18 – 2023-06-25 (×2): qty 135, 90d supply, fill #0
  Filled 2024-04-01: qty 135, 90d supply, fill #1

## 2023-05-12 MED ORDER — LAMOTRIGINE 100 MG PO TABS
100.0000 mg | ORAL_TABLET | Freq: Two times a day (BID) | ORAL | 1 refills | Status: DC
  Filled 2023-05-18 – 2023-06-25 (×2): qty 180, 90d supply, fill #0

## 2023-05-12 MED ORDER — HYDROXYZINE HCL 10 MG PO TABS
20.0000 mg | ORAL_TABLET | Freq: Two times a day (BID) | ORAL | 1 refills | Status: DC | PRN
  Filled 2023-05-12: qty 120, 30d supply, fill #0

## 2023-05-19 ENCOUNTER — Other Ambulatory Visit (HOSPITAL_COMMUNITY): Payer: Self-pay

## 2023-05-19 MED ORDER — ZEPBOUND 5 MG/0.5ML ~~LOC~~ SOAJ
5.0000 mg | SUBCUTANEOUS | 1 refills | Status: DC
  Filled 2023-05-19: qty 2, 28d supply, fill #0
  Filled 2023-06-14: qty 2, 28d supply, fill #1

## 2023-05-19 NOTE — Addendum Note (Signed)
Addended by: Jimmye Norman on: 05/19/2023 07:19 AM   Modules accepted: Orders

## 2023-06-15 ENCOUNTER — Other Ambulatory Visit: Payer: Self-pay

## 2023-06-25 ENCOUNTER — Other Ambulatory Visit: Payer: Self-pay

## 2023-06-25 ENCOUNTER — Other Ambulatory Visit: Payer: Self-pay | Admitting: Physician Assistant

## 2023-06-25 ENCOUNTER — Other Ambulatory Visit (HOSPITAL_COMMUNITY): Payer: Self-pay

## 2023-06-25 MED ORDER — ZEPBOUND 7.5 MG/0.5ML ~~LOC~~ SOAJ
7.5000 mg | SUBCUTANEOUS | 0 refills | Status: DC
  Filled 2023-06-25: qty 2, 28d supply, fill #0

## 2023-07-21 ENCOUNTER — Other Ambulatory Visit (HOSPITAL_COMMUNITY): Payer: Self-pay

## 2023-07-21 MED ORDER — CLONAZEPAM 0.5 MG PO TABS
0.5000 mg | ORAL_TABLET | Freq: Two times a day (BID) | ORAL | 0 refills | Status: DC | PRN
  Filled 2023-07-21: qty 30, 15d supply, fill #0

## 2023-07-26 ENCOUNTER — Encounter (HOSPITAL_COMMUNITY): Payer: Self-pay

## 2023-07-27 ENCOUNTER — Other Ambulatory Visit (HOSPITAL_COMMUNITY): Payer: Self-pay

## 2023-07-29 ENCOUNTER — Other Ambulatory Visit (HOSPITAL_COMMUNITY): Payer: Self-pay

## 2023-08-02 ENCOUNTER — Other Ambulatory Visit (HOSPITAL_COMMUNITY): Payer: Self-pay

## 2023-08-02 ENCOUNTER — Other Ambulatory Visit: Payer: Self-pay | Admitting: Physician Assistant

## 2023-08-02 MED ORDER — ZEPBOUND 5 MG/0.5ML ~~LOC~~ SOAJ
5.0000 mg | SUBCUTANEOUS | 1 refills | Status: DC
  Filled 2023-08-02: qty 2, 28d supply, fill #0
  Filled 2024-01-25 – 2024-01-28 (×2): qty 2, 28d supply, fill #1

## 2023-08-05 ENCOUNTER — Other Ambulatory Visit (HOSPITAL_COMMUNITY): Payer: Self-pay

## 2023-09-08 ENCOUNTER — Telehealth: Admitting: Physician Assistant

## 2023-09-08 DIAGNOSIS — J019 Acute sinusitis, unspecified: Secondary | ICD-10-CM

## 2023-09-08 DIAGNOSIS — B9689 Other specified bacterial agents as the cause of diseases classified elsewhere: Secondary | ICD-10-CM | POA: Diagnosis not present

## 2023-09-08 MED ORDER — AMOXICILLIN-POT CLAVULANATE 875-125 MG PO TABS: 1.0000 | ORAL_TABLET | Freq: Two times a day (BID) | ORAL | 0 refills | Status: DC

## 2023-09-08 NOTE — Progress Notes (Signed)
 I have spent 5 minutes in review of e-visit questionnaire, review and updating patient chart, medical decision making and response to patient.   Piedad Climes, PA-C

## 2023-09-08 NOTE — Progress Notes (Signed)

## 2023-09-23 ENCOUNTER — Other Ambulatory Visit (HOSPITAL_COMMUNITY): Payer: Self-pay

## 2023-09-23 ENCOUNTER — Other Ambulatory Visit: Payer: Self-pay

## 2023-09-27 ENCOUNTER — Other Ambulatory Visit (HOSPITAL_COMMUNITY): Payer: Self-pay

## 2023-11-26 ENCOUNTER — Other Ambulatory Visit (HOSPITAL_COMMUNITY): Payer: Self-pay

## 2023-12-02 ENCOUNTER — Other Ambulatory Visit: Payer: Self-pay

## 2023-12-02 ENCOUNTER — Other Ambulatory Visit (HOSPITAL_COMMUNITY): Payer: Self-pay

## 2023-12-02 MED ORDER — DULOXETINE HCL 60 MG PO CPEP
60.0000 mg | ORAL_CAPSULE | Freq: Every day | ORAL | 0 refills | Status: DC
  Filled 2023-12-28: qty 45, 45d supply, fill #0
  Filled 2023-12-28: qty 90, 90d supply, fill #0

## 2023-12-02 MED ORDER — REXULTI 0.5 MG PO TABS
1.0000 | ORAL_TABLET | Freq: Every day | ORAL | 1 refills | Status: DC
  Filled 2023-12-02: qty 30, 30d supply, fill #0
  Filled 2023-12-28: qty 30, 30d supply, fill #1

## 2023-12-02 MED ORDER — BUSPIRONE HCL 30 MG PO TABS
15.0000 mg | ORAL_TABLET | Freq: Three times a day (TID) | ORAL | 0 refills | Status: DC
  Filled 2023-12-02 – 2023-12-28 (×2): qty 135, 90d supply, fill #0

## 2023-12-02 MED ORDER — LAMOTRIGINE 100 MG PO TABS
100.0000 mg | ORAL_TABLET | Freq: Two times a day (BID) | ORAL | 1 refills | Status: DC
  Filled 2023-12-28: qty 180, 90d supply, fill #0

## 2023-12-06 ENCOUNTER — Other Ambulatory Visit (HOSPITAL_COMMUNITY): Payer: Self-pay

## 2023-12-28 ENCOUNTER — Other Ambulatory Visit (HOSPITAL_COMMUNITY): Payer: Self-pay

## 2023-12-28 ENCOUNTER — Other Ambulatory Visit: Payer: Self-pay

## 2023-12-28 MED ORDER — CLINDAMYCIN HCL 150 MG PO CAPS
150.0000 mg | ORAL_CAPSULE | Freq: Four times a day (QID) | ORAL | 0 refills | Status: DC
  Filled 2023-12-28: qty 40, 10d supply, fill #0

## 2024-01-21 ENCOUNTER — Encounter: Payer: Self-pay | Admitting: Physician Assistant

## 2024-01-24 ENCOUNTER — Encounter: Payer: Self-pay | Admitting: Orthopedic Surgery

## 2024-01-24 ENCOUNTER — Ambulatory Visit (INDEPENDENT_AMBULATORY_CARE_PROVIDER_SITE_OTHER): Admitting: Orthopedic Surgery

## 2024-01-24 DIAGNOSIS — M65331 Trigger finger, right middle finger: Secondary | ICD-10-CM | POA: Diagnosis not present

## 2024-01-24 NOTE — Progress Notes (Unsigned)
 Office Visit Note   Patient: Carolyn Gilmore           Date of Birth: 27-Feb-1981           MRN: [id number] Visit Date: 01/24/2024 Requested by: Job Lukes, [place] [address] [place],  [place] [address] PCP: Job Lukes, [place]  Subjective: Chief Complaint  Patient presents with   Right Hand - Pain    Trigger finger    HPI: Carolyn Gilmore is a 43 y.o. female who presents to the office reporting right third trigger finger.  Patient has had prior injections which she states helped some but that was last year.  Finger is locking up on a daily basis and it is fairly excruciating for about a month the digit.  Denies any history of injury or trauma.  Has tried over-the-counter medication without much relief.  She works as a Firefighter in cardiac medicine.  This does not require too much in terms of physical activity..                ROS: All systems reviewed are negative as they relate to the chief complaint within the history of present illness.  Patient denies fevers or chills.  Assessment & Plan: Visit Diagnoses: No diagnosis found.  Plan: Impression is symptomatic right third trigger finger.  Would not be in favor of another injection because of the risk of tendon rupture.  I think if she can live with this that would be 1 option and the other option is surgical release.  Risk and benefits of surgical release are discussed with the patient including but limited to infection nerve damage as well as incomplete pain relief.  The rehab process is also discussed.  In general and have her in a waterproof dressing until the sutures are removed and have her start doing finger range of motion exercises on the day of surgery.  Patient understands the risk and benefits and wishes to proceed.  All questions answered  Follow-Up Instructions: No follow-ups on file.   Orders:  No orders of the defined types were placed in this encounter.  No orders of the defined types were placed in this  encounter.     Procedures: No procedures performed   Clinical Data: No additional findings.  Objective: Vital Signs: There were no vitals taken for this visit.  Physical Exam:  Constitutional: Patient appears well-developed HEENT:  Head: Normocephalic Eyes:EOM are normal Neck: Normal range of motion Cardiovascular: Normal rate Pulmonary/chest: Effort normal Neurologic: Patient is alert Skin: Skin is warm Psychiatric: Patient has normal mood and affect  Ortho Exam: Ortho exam demonstrates tenderness to palpation of the right A1 pulley.  Patient does have full composite range of motion of the MCP PIP and DIP joints in all fingers of the right hand.  Fingers are perfused and sensate.  No cystic structures palpated around the A1 pulley of the third finger.  Specialty Comments:  No specialty comments available.  Imaging: No results found.   PMFS History: Patient Active Problem List   Diagnosis Date Noted   Chronic rhinitis 12/15/2021   Effusion, left knee 12/22/2018   Melanoma (HCC)    IBS (irritable bowel syndrome)    Depression    Anxiety    Melanoma of thigh, right (HCC) 06/29/2014   Vitamin D  deficiency 06/29/2013   Vitamin B 12 deficiency 06/29/2013   Past Medical History:  Diagnosis Date   Anxiety    Depression    Elevated liver enzymes 10/09/2016  RUQ ultrasound -- showed some gallstones   Genital warts    Gestational diabetes 2016   HPV in female    Hx of gallstones 10/2016   Hypertension    IBS (irritable bowel syndrome)    Melanoma (HCC)    R thigh   Melanoma of thigh, right (HCC) 2016   PCOS (polycystic ovarian syndrome)    Vitamin B 12 deficiency 2015   Vitamin D  deficiency 2015    Family History  Problem Relation Age of Onset   Hypertension Mother    Atrial fibrillation Mother    Hypertension Father    Hyperlipidemia Father    Diabetes Brother     Past Surgical History:  Procedure Laterality Date   CHOLECYSTECTOMY N/A 03/19/2017    Procedure: LAPAROSCOPIC CHOLECYSTECTOMY;  Surgeon: Signe Mitzie LABOR, MD;  Location: [facility] OR;  Service: General;  Laterality: N/A;   MOLE REMOVAL N/A 2016   Right thigh, Melanoma  in office   WISDOM TOOTH EXTRACTION     Social History   Occupational History   Not on file  Tobacco Use   Smoking status: Never   Smokeless tobacco: Never  Vaping Use   Vaping status: Never Used  Substance and Sexual Activity   Alcohol use: Yes    Comment: social   Drug use: No   Sexual activity: Yes    Partners: Male    Birth control/protection: I.U.D.

## 2024-01-25 ENCOUNTER — Other Ambulatory Visit (HOSPITAL_COMMUNITY): Payer: Self-pay

## 2024-01-26 ENCOUNTER — Other Ambulatory Visit (HOSPITAL_COMMUNITY): Payer: Self-pay

## 2024-01-26 ENCOUNTER — Telehealth: Payer: Self-pay

## 2024-01-26 MED ORDER — REXULTI 0.5 MG PO TABS
1.0000 | ORAL_TABLET | Freq: Every day | ORAL | 1 refills | Status: DC
  Filled 2024-01-26: qty 30, 30d supply, fill #0

## 2024-01-26 NOTE — Telephone Encounter (Signed)
 Pharmacy Patient Advocate Encounter   Received notification from CoverMyMeds that prior authorization for Zepbound  5MG /0.5ML pen-injectors is required/requested.   Insurance verification completed.   The patient is insured through [employer] .   Per test claim: PA required; PA submitted to above mentioned insurance via CoverMyMeds Key/confirmation #/EOC [facility] Status is pending

## 2024-01-27 ENCOUNTER — Other Ambulatory Visit: Payer: Self-pay

## 2024-01-28 ENCOUNTER — Other Ambulatory Visit (HOSPITAL_COMMUNITY): Payer: Self-pay

## 2024-02-01 ENCOUNTER — Other Ambulatory Visit (HOSPITAL_COMMUNITY): Payer: Self-pay

## 2024-02-01 ENCOUNTER — Ambulatory Visit: Admitting: Orthopaedic Surgery

## 2024-02-01 NOTE — Telephone Encounter (Signed)
 Pharmacy Patient Advocate Encounter  Received notification from [facility] that Prior Authorization for Zepbound  5MG /0.5ML pen-injectors has been DENIED.  Full denial letter will be uploaded to the media tab. See denial reason below.    PA #/Case ID/Reference #: [id number]

## 2024-02-01 NOTE — Telephone Encounter (Signed)
 Carolyn Gilmore, please see message. Zepbound  denied.

## 2024-02-02 NOTE — Telephone Encounter (Signed)
 Carolyn Gilmore, pt was paying out of pocket and is just wanting to restart Zepbound . Please advise on dose?

## 2024-02-03 ENCOUNTER — Encounter (HOSPITAL_BASED_OUTPATIENT_CLINIC_OR_DEPARTMENT_OTHER): Admission: RE | Payer: Self-pay | Source: Home / Self Care

## 2024-02-03 ENCOUNTER — Ambulatory Visit (HOSPITAL_BASED_OUTPATIENT_CLINIC_OR_DEPARTMENT_OTHER): Admission: RE | Admit: 2024-02-03 | Source: Home / Self Care | Admitting: Orthopedic Surgery

## 2024-02-03 SURGERY — RELEASE, A1 PULLEY, FOR TRIGGER FINGER
Anesthesia: Regional | Site: Middle Finger | Laterality: Right

## 2024-02-14 ENCOUNTER — Other Ambulatory Visit (HOSPITAL_COMMUNITY): Payer: Self-pay

## 2024-02-14 ENCOUNTER — Other Ambulatory Visit: Payer: Self-pay

## 2024-02-14 MED ORDER — CLONAZEPAM 0.5 MG PO TABS
0.5000 mg | ORAL_TABLET | Freq: Every day | ORAL | 0 refills | Status: DC | PRN
  Filled 2024-02-14: qty 30, 30d supply, fill #0

## 2024-02-14 MED ORDER — HYDROXYZINE HCL 10 MG PO TABS
20.0000 mg | ORAL_TABLET | Freq: Two times a day (BID) | ORAL | 1 refills | Status: AC | PRN
  Filled 2024-02-14: qty 120, 30d supply, fill #0

## 2024-02-14 MED ORDER — LAMOTRIGINE 100 MG PO TABS
100.0000 mg | ORAL_TABLET | Freq: Two times a day (BID) | ORAL | 1 refills | Status: AC
  Filled 2024-04-01: qty 180, 90d supply, fill #0
  Filled 2024-07-24: qty 180, 90d supply, fill #1

## 2024-02-14 MED ORDER — BUSPIRONE HCL 30 MG PO TABS: 15.0000 mg | ORAL_TABLET | Freq: Three times a day (TID) | ORAL | 0 refills | Status: DC

## 2024-02-14 MED ORDER — DULOXETINE HCL 60 MG PO CPEP
60.0000 mg | ORAL_CAPSULE | Freq: Every day | ORAL | 0 refills | Status: DC
  Filled 2024-02-14 – 2024-02-17 (×2): qty 90, 90d supply, fill #0

## 2024-02-14 MED ORDER — REXULTI 0.5 MG PO TABS
1.0000 | ORAL_TABLET | Freq: Every day | ORAL | 1 refills | Status: DC
  Filled 2024-03-03: qty 30, 30d supply, fill #0
  Filled 2024-03-12 – 2024-04-01 (×2): qty 30, 30d supply, fill #1
  Filled ????-??-??: fill #1

## 2024-02-14 NOTE — Telephone Encounter (Signed)
 See message and advise if okay to send dose pt requesting 15 mg?

## 2024-02-17 ENCOUNTER — Other Ambulatory Visit (HOSPITAL_COMMUNITY): Payer: Self-pay

## 2024-03-02 ENCOUNTER — Other Ambulatory Visit: Payer: Self-pay

## 2024-03-02 ENCOUNTER — Encounter (HOSPITAL_BASED_OUTPATIENT_CLINIC_OR_DEPARTMENT_OTHER): Payer: Self-pay | Admitting: Orthopedic Surgery

## 2024-03-02 DIAGNOSIS — Z1231 Encounter for screening mammogram for malignant neoplasm of breast: Secondary | ICD-10-CM

## 2024-03-03 ENCOUNTER — Other Ambulatory Visit: Payer: Self-pay

## 2024-03-09 NOTE — Anesthesia Preprocedure Evaluation (Signed)
 Anesthesia Evaluation  Patient identified by MRN, date of birth, ID band Patient awake    Reviewed: Allergy  & Precautions, NPO status , Patient's Chart, lab work & pertinent test results  History of Anesthesia Complications Negative for: history of anesthetic complications  Airway Mallampati: II  TM Distance: >3 FB Neck ROM: Full    Dental no notable dental hx. (+) Teeth Intact   Pulmonary neg pulmonary ROS   Pulmonary exam normal breath sounds clear to auscultation       Cardiovascular (-) hypertension(-) angina (-) Past MI Normal cardiovascular exam Rhythm:Regular Rate:Normal     Neuro/Psych negative neurological ROS     GI/Hepatic ,neg GERD  ,,  Endo/Other  neg diabetes    Renal/GU      Musculoskeletal negative musculoskeletal ROS (+)    Abdominal   Peds  Hematology negative hematology ROS (+)   Anesthesia Other Findings   Reproductive/Obstetrics                              Anesthesia Physical Anesthesia Plan  ASA: 2  Anesthesia Plan: Regional and MAC   Post-op Pain Management: Tylenol  PO (pre-op)*   Induction:   PONV Risk Score and Plan: 2 and Propofol  infusion and Treatment may vary due to age or medical condition  Airway Management Planned: Nasal Cannula and Natural Airway  Additional Equipment: None  Intra-op Plan:   Post-operative Plan:   Informed Consent: I have reviewed the patients History and Physical, chart, labs and discussed the procedure including the risks, benefits and alternatives for the proposed anesthesia with the patient or authorized representative who has indicated his/her understanding and acceptance.     Dental advisory given  Plan Discussed with: CRNA and Surgeon  Anesthesia Plan Comments:          Anesthesia Quick Evaluation

## 2024-03-10 ENCOUNTER — Other Ambulatory Visit: Payer: Self-pay

## 2024-03-10 ENCOUNTER — Encounter (HOSPITAL_BASED_OUTPATIENT_CLINIC_OR_DEPARTMENT_OTHER): Payer: Self-pay | Admitting: Orthopedic Surgery

## 2024-03-10 ENCOUNTER — Ambulatory Visit (HOSPITAL_BASED_OUTPATIENT_CLINIC_OR_DEPARTMENT_OTHER): Payer: Self-pay | Admitting: Anesthesiology

## 2024-03-10 ENCOUNTER — Ambulatory Visit (HOSPITAL_BASED_OUTPATIENT_CLINIC_OR_DEPARTMENT_OTHER)
Admission: RE | Admit: 2024-03-10 | Discharge: 2024-03-10 | Disposition: A | Discharge status: Home / Self Care | Attending: Orthopedic Surgery | Admitting: Orthopedic Surgery

## 2024-03-10 ENCOUNTER — Other Ambulatory Visit (HOSPITAL_COMMUNITY): Payer: Self-pay

## 2024-03-10 ENCOUNTER — Encounter (HOSPITAL_BASED_OUTPATIENT_CLINIC_OR_DEPARTMENT_OTHER)
Admission: RE | Disposition: A | Discharge status: Home / Self Care | Payer: Self-pay | Source: Home / Self Care | Attending: Orthopedic Surgery

## 2024-03-10 DIAGNOSIS — Z01818 Encounter for other preprocedural examination: Secondary | ICD-10-CM

## 2024-03-10 DIAGNOSIS — M65331 Trigger finger, right middle finger: Secondary | ICD-10-CM

## 2024-03-10 HISTORY — PX: TRIGGER FINGER RELEASE: SHX641

## 2024-03-10 LAB — POCT PREGNANCY, URINE: Preg Test, Ur: NEGATIVE

## 2024-03-10 SURGERY — RELEASE, A1 PULLEY, FOR TRIGGER FINGER
Anesthesia: Monitor Anesthesia Care | Laterality: Right

## 2024-03-10 MED ORDER — ACETAMINOPHEN 500 MG PO TABS: 1000.0000 mg | ORAL_TABLET | Freq: Once | ORAL | Status: DC

## 2024-03-10 MED ORDER — MIDAZOLAM HCL 2 MG/2ML IJ SOLN
INTRAMUSCULAR | Status: AC
  Filled 2024-03-10: qty 2

## 2024-03-10 MED ORDER — ONDANSETRON HCL 4 MG/2ML IJ SOLN: 4.0000 mg | Freq: Once | INTRAMUSCULAR | Status: DC | PRN

## 2024-03-10 MED ORDER — ONDANSETRON HCL 4 MG/2ML IJ SOLN
INTRAMUSCULAR | Status: AC
Start: 2024-03-10 — End: 2024-03-10
  Filled 2024-03-10: qty 2

## 2024-03-10 MED ORDER — 0.9 % SODIUM CHLORIDE (POUR BTL) OPTIME
TOPICAL | Status: DC | PRN
  Administered 2024-03-10: 1000 mL

## 2024-03-10 MED ORDER — MIDAZOLAM HCL 2 MG/2ML IJ SOLN
2.0000 mg | Freq: Once | INTRAMUSCULAR | Status: AC
  Administered 2024-03-10: 2 mg via INTRAVENOUS

## 2024-03-10 MED ORDER — CEFAZOLIN SODIUM-DEXTROSE 2-4 GM/100ML-% IV SOLN
INTRAVENOUS | Status: AC
  Filled 2024-03-10: qty 100

## 2024-03-10 MED ORDER — FENTANYL CITRATE (PF) 100 MCG/2ML IJ SOLN
INTRAMUSCULAR | Status: AC
  Filled 2024-03-10: qty 2

## 2024-03-10 MED ORDER — LIDOCAINE 2% (20 MG/ML) 5 ML SYRINGE
INTRAMUSCULAR | Status: DC | PRN
  Administered 2024-03-10: 100 mg via INTRAVENOUS

## 2024-03-10 MED ORDER — PROPOFOL 10 MG/ML IV BOLUS
INTRAVENOUS | Status: DC | PRN
  Administered 2024-03-10: 20 mg via INTRAVENOUS

## 2024-03-10 MED ORDER — PROPOFOL 500 MG/50ML IV EMUL
INTRAVENOUS | Status: AC
  Filled 2024-03-10: qty 50

## 2024-03-10 MED ORDER — FENTANYL CITRATE (PF) 100 MCG/2ML IJ SOLN: 25.0000 ug | INTRAMUSCULAR | Status: DC | PRN

## 2024-03-10 MED ORDER — LACTATED RINGERS IV SOLN: INTRAVENOUS | Status: DC

## 2024-03-10 MED ORDER — ACETAMINOPHEN 10 MG/ML IV SOLN
INTRAVENOUS | Status: AC
  Filled 2024-03-10: qty 100

## 2024-03-10 MED ORDER — CEFAZOLIN SODIUM-DEXTROSE 2-4 GM/100ML-% IV SOLN
2.0000 g | INTRAVENOUS | Status: AC
  Administered 2024-03-10: 2 g via INTRAVENOUS

## 2024-03-10 MED ORDER — ACETAMINOPHEN-CODEINE 300-30 MG PO TABS
1.0000 | ORAL_TABLET | Freq: Four times a day (QID) | ORAL | 0 refills | Status: DC | PRN
  Filled 2024-03-10: qty 30, 8d supply, fill #0

## 2024-03-10 MED ORDER — FENTANYL CITRATE (PF) 100 MCG/2ML IJ SOLN
INTRAMUSCULAR | Status: DC | PRN
  Administered 2024-03-10: 50 ug via INTRAVENOUS

## 2024-03-10 MED ORDER — BUPIVACAINE HCL (PF) 0.5 % IJ SOLN
INTRAMUSCULAR | Status: DC | PRN
  Administered 2024-03-10: 21 mL via PERINEURAL

## 2024-03-10 MED ORDER — PROPOFOL 500 MG/50ML IV EMUL
INTRAVENOUS | Status: DC | PRN
  Administered 2024-03-10: 150 ug/kg/min via INTRAVENOUS
  Administered 2024-03-10: 100 ug/kg/min via INTRAVENOUS

## 2024-03-10 MED ORDER — DEXMEDETOMIDINE HCL IN NACL 80 MCG/20ML IV SOLN
INTRAVENOUS | Status: AC
Start: 2024-03-10 — End: 2024-03-10
  Filled 2024-03-10: qty 20

## 2024-03-10 MED ORDER — DEXMEDETOMIDINE HCL IN NACL 80 MCG/20ML IV SOLN
INTRAVENOUS | Status: DC | PRN
  Administered 2024-03-10: 4 ug via INTRAVENOUS

## 2024-03-10 MED ORDER — ACETAMINOPHEN 10 MG/ML IV SOLN: 1000.0000 mg | Freq: Once | INTRAVENOUS | Status: DC | PRN

## 2024-03-10 MED ORDER — POVIDONE-IODINE 7.5 % EX SOLN
Freq: Once | CUTANEOUS | Status: AC
Start: 2024-03-10 — End: 2024-03-10
  Filled 2024-03-10: qty 118

## 2024-03-10 MED ORDER — ONDANSETRON HCL 4 MG/2ML IJ SOLN
INTRAMUSCULAR | Status: DC | PRN
  Administered 2024-03-10: 4 mg via INTRAVENOUS

## 2024-03-10 MED ORDER — POVIDONE-IODINE 10 % EX SWAB
2.0000 | Freq: Once | CUTANEOUS | Status: AC
  Administered 2024-03-10: 2 via TOPICAL

## 2024-03-10 MED ORDER — ACETAMINOPHEN 10 MG/ML IV SOLN
INTRAVENOUS | Status: DC | PRN
  Administered 2024-03-10: 1000 mg via INTRAVENOUS

## 2024-03-10 SURGICAL SUPPLY — 36 items
BLADE SURG 15 STRL LF DISP TIS (BLADE) ×1 IMPLANT
BNDG COHESIVE 2X5 TAN ST LF (GAUZE/BANDAGES/DRESSINGS) IMPLANT
BNDG COHESIVE 3X5 TAN ST LF (GAUZE/BANDAGES/DRESSINGS) IMPLANT
BNDG COMPR ESMARK 4X3 LF (GAUZE/BANDAGES/DRESSINGS) IMPLANT
BNDG ELASTIC 3INX 5YD STR LF (GAUZE/BANDAGES/DRESSINGS) IMPLANT
BNDG ELASTIC 4INX 5YD STR LF (GAUZE/BANDAGES/DRESSINGS) IMPLANT
CLEANER CAUTERY TIP PAD (MISCELLANEOUS) IMPLANT
CORD BIPOLAR FORCEPS 12FT (ELECTRODE) ×1 IMPLANT
COVER BACK TABLE 60X90IN (DRAPES) ×1 IMPLANT
COVER MAYO STAND STRL (DRAPES) ×1 IMPLANT
CUFF TOURN SGL QUICK 18X4 (TOURNIQUET CUFF) ×1 IMPLANT
DRAPE EXTREMITY T 121X128X90 (DISPOSABLE) ×1 IMPLANT
DRAPE INCISE IOBAN 66X45 STRL (DRAPES) IMPLANT
DRAPE SURG 17X23 STRL (DRAPES) IMPLANT
DURAPREP 26ML APPLICATOR (WOUND CARE) ×1 IMPLANT
ELECTRODE REM PT RTRN 9FT ADLT (ELECTROSURGICAL) IMPLANT
GAUZE XEROFORM 1X8 LF (GAUZE/BANDAGES/DRESSINGS) IMPLANT
GLOVE BIO SURGEON STRL SZ7 (GLOVE) ×1 IMPLANT
GLOVE BIO SURGEON STRL SZ8 (GLOVE) ×1 IMPLANT
GLOVE BIOGEL PI IND STRL 7.0 (GLOVE) ×1 IMPLANT
GLOVE BIOGEL PI IND STRL 8 (GLOVE) ×1 IMPLANT
GOWN STRL REUS W/ TWL LRG LVL3 (GOWN DISPOSABLE) ×1 IMPLANT
NDL PRECISIONGLIDE 27X1.5 (NEEDLE) ×1 IMPLANT
NEEDLE PRECISIONGLIDE 27X1.5 (NEEDLE) ×1 IMPLANT
NS IRRIG 1000ML POUR BTL (IV SOLUTION) IMPLANT
PACK BASIN DAY SURGERY FS (CUSTOM PROCEDURE TRAY) ×1 IMPLANT
PAD CAST 3X4 CTTN HI CHSV (CAST SUPPLIES) IMPLANT
PAD CAST 4YDX4 CTTN HI CHSV (CAST SUPPLIES) IMPLANT
PENCIL SMOKE EVACUATOR (MISCELLANEOUS) IMPLANT
SLING ARM FOAM STRAP LRG (SOFTGOODS) IMPLANT
STOCKINETTE 4X48 STRL (DRAPES) ×1 IMPLANT
SUT ETHILON 3 0 PS 1 (SUTURE) IMPLANT
SYR BULB EAR ULCER 3OZ GRN STR (SYRINGE) IMPLANT
SYR CONTROL 10ML LL (SYRINGE) ×1 IMPLANT
TOWEL GREEN STERILE FF (TOWEL DISPOSABLE) ×1 IMPLANT
UNDERPAD 30X36 HEAVY ABSORB (UNDERPADS AND DIAPERS) ×1 IMPLANT

## 2024-03-10 NOTE — Progress Notes (Signed)
Assisted Dr. Richardson Landry with right, supraclavicular, ultrasound guided block. Side rails up, monitors on throughout procedure. See vital signs in flow sheet. Tolerated Procedure well.

## 2024-03-10 NOTE — Op Note (Signed)
 Carolyn Gilmore, Carolyn Gilmore MEDICAL RECORD NO: [id number] ACCOUNT NO: [id number] DATE OF BIRTH: 1981/02/14 FACILITY: [facility] LOCATION: [facility] PHYSICIAN: Cordella RAMAN. Addie, MD  Operative Report   PREOPERATIVE DIAGNOSIS:  Right third trigger finger.  POSTOPERATIVE DIAGNOSIS:  Right third trigger finger.  PROCEDURE:  Right third trigger finger release.  SURGEON:  Cordella RAMAN. Addie, MD.  ASSISTANT:  None.  ANESTHESIA:  Regional.  INDICATIONS:  The patient is a 43 year old patient with right trigger finger refractory to nonoperative management. She presents for operative management after explanation of the risks and benefits.  PROCEDURE IN DETAIL:  The patient was brought to the operating room where regional anesthetic was induced.  Timeout was called.  Right hand prescrubbed with alcohol and Betadine , allowed air to dry and prepped with DuraPrep solution and draped in a  sterile manner.  The tourniquet was inflated, which was a forearm tourniquet.  Incision was made over the A1 pulley of the third finger.  Skin and subcutaneous tissues were sharply divided.  Neurovascular structures protected on the radial and ulnar  aspect of the tendon sheath.  The patient did have a small cyst which was decompressed with release of the A1 pulley.  Complete A1 pulley release was visualized. Finger was taken through range of motion and there was no locking or triggering.  Thorough  irrigation was performed.  Tourniquet released.  Bleeding points were encountered and controlled using bipolar electrocautery. The incision was closed using 3-0 nylon sutures. Impervious dressing and Ace wrap placed.  The patient tolerated the procedure  well without immediate complications and transferred to the recovery room in stable condition.   NIK D: 03/10/2024 9:50:22 am T: 03/10/2024 10:43:00 am  JOB: [id number]/ [id number]

## 2024-03-10 NOTE — Brief Op Note (Signed)
   03/10/2024  9:48 AM  PATIENT:  Carolyn Gilmore  43 y.o. female  PRE-OPERATIVE DIAGNOSIS:  right middle trigger finger  POST-OPERATIVE DIAGNOSIS:  right middle trigger finger  PROCEDURE:  Procedure(s): RELEASE, A1 PULLEY, FOR TRIGGER FINGER  SURGEON:  Surgeon(s): Addie Cordella Hamilton, MD  ASSISTANT: none  ANESTHESIA:   regional  EBL: 1 ml    Total I/O In: 400 [I.V.:400] Out: -   BLOOD ADMINISTERED: none  DRAINS: none   LOCAL MEDICATIONS USED:  none  SPECIMEN:  No Specimen  COUNTS:  YES  TOURNIQUET:   Total Tourniquet Time Documented: Forearm (Right) - 11 minutes Total: Forearm (Right) - 11 minutes   DICTATION: .Other Dictation: Dictation Number done  PLAN OF CARE: Discharge to home after PACU  PATIENT DISPOSITION:  PACU - hemodynamically stable

## 2024-03-10 NOTE — Discharge Instructions (Signed)
  Post Anesthesia Home Care Instructions  Activity: Get plenty of rest for the remainder of the day. A responsible individual must stay with you for 24 hours following the procedure.  For the next 24 hours, DO NOT: -Drive a car -Advertising copywriter -Drink alcoholic beverages -Take any medication unless instructed by your physician -Make any legal decisions or sign important papers.  Meals: Start with liquid foods such as gelatin or soup. Progress to regular foods as tolerated. Avoid greasy, spicy, heavy foods. If nausea and/or vomiting occur, drink only clear liquids until the nausea and/or vomiting subsides. Call your physician if vomiting continues.  Special Instructions/Symptoms: Your throat may feel dry or sore from the anesthesia or the breathing tube placed in your throat during surgery. If this causes discomfort, gargle with warm salt water. The discomfort should disappear within 24 hours.  If you had a scopolamine  patch placed behind your ear for the management of post- operative nausea and/or vomiting:  1. The medication in the patch is effective for 72 hours, after which it should be removed.  Wrap patch in a tissue and discard in the trash. Wash hands thoroughly with soap and water. 2. You may remove the patch earlier than 72 hours if you experience unpleasant side effects which may include dry mouth, dizziness or visual disturbances. 3. Avoid touching the patch. Wash your hands with soap and water after contact with the patch.   Regional Anesthesia Blocks  1. You may not be able to move or feel the blocked extremity after a regional anesthetic block. This may last may last from 3-48 hours after placement, but it will go away. The length of time depends on the medication injected and your individual response to the medication. As the nerves start to wake up, you may experience tingling as the movement and feeling returns to your extremity. If the numbness and inability to move your  extremity has not gone away after 48 hours, please call your surgeon.   2. The extremity that is blocked will need to be protected until the numbness is gone and the strength has returned. Because you cannot feel it, you will need to take extra care to avoid injury. Because it may be weak, you may have difficulty moving it or using it. You may not know what position it is in without looking at it while the block is in effect.  3. For blocks in the legs and feet, returning to weight bearing and walking needs to be done carefully. You will need to wait until the numbness is entirely gone and the strength has returned. You should be able to move your leg and foot normally before you try and bear weight or walk. You will need someone to be with you when you first try to ensure you do not fall and possibly risk injury.  4. Bruising and tenderness at the needle site are common side effects and will resolve in a few days.  5. Persistent numbness or new problems with movement should be communicated to the surgeon or the [facility] [phone] [facility] [phone]).Call your surgeon if you experience:   1.  Fever over 101.0. 2.  Inability to urinate. 3.  Nausea and/or vomiting. 4.  Extreme swelling or bruising at the surgical site. 5.  Continued bleeding from the incision. 6.  Increased pain, redness or drainage from the incision. 7.  Problems related to your pain medication. 8.  Any problems and/or concerns

## 2024-03-10 NOTE — Anesthesia Postprocedure Evaluation (Signed)
 Anesthesia Post Note  Patient: Carolyn Gilmore  Procedure(s) Performed: RELEASE, A1 PULLEY, FOR TRIGGER FINGER (Right)     Patient location during evaluation: PACU Anesthesia Type: Regional Level of consciousness: awake and alert Pain management: pain level controlled Vital Signs Assessment: post-procedure vital signs reviewed and stable Respiratory status: spontaneous breathing, nonlabored ventilation, respiratory function stable and patient connected to nasal cannula oxygen Cardiovascular status: stable and blood pressure returned to baseline Postop Assessment: no apparent nausea or vomiting Anesthetic complications: no   No notable events documented.  Last Vitals:  Vitals:   03/10/24 0958 03/10/24 1000  BP:  129/86  Pulse: 70 66  Resp: 16 16  Temp:    SpO2: 96% 96%    Last Pain:  Vitals:   03/10/24 0955  TempSrc:   PainSc: 0-No pain                 Garnette DELENA Gab

## 2024-03-10 NOTE — H&P (Signed)
 Carolyn Gilmore is an 43 y.o. female.   Chief Complaint: right finger pain HPI: Carolyn Gilmore is a 43 y.o. female who presents  reporting right third trigger finger.  Patient has had prior injections which she states helped some but that was last year.  Finger is locking up on a daily basis and it is fairly excruciating for about a month the digit.  Denies any history of injury or trauma.  Has tried over-the-counter medication without much relief.  She works as a Firefighter in cardiac medicine.  This does not require too much in terms of physical activity..    Past Medical History:  Diagnosis Date   Anxiety    Depression    Elevated liver enzymes 10/09/2016   RUQ ultrasound -- showed some gallstones   Genital warts    HPV in female    Hx of gallstones 10/2016   IBS (irritable bowel syndrome)    Melanoma (HCC)    R thigh   Melanoma of thigh, right (HCC) 2016   PCOS (polycystic ovarian syndrome)    Vitamin B 12 deficiency 2015   Vitamin D  deficiency 2015    Past Surgical History:  Procedure Laterality Date   CHOLECYSTECTOMY N/A 03/19/2017   Procedure: LAPAROSCOPIC CHOLECYSTECTOMY;  Surgeon: Signe Mitzie LABOR, MD;  Location: [facility] OR;  Service: General;  Laterality: N/A;   MOLE REMOVAL N/A 2016   Right thigh, Melanoma  in office   WISDOM TOOTH EXTRACTION      Family History  Problem Relation Age of Onset   Hypertension Mother    Atrial fibrillation Mother    Hypertension Father    Hyperlipidemia Father    Diabetes Brother    Social History:  reports that she has never smoked. She has never used smokeless tobacco. She reports current alcohol use. She reports that she does not use drugs.  Allergies:  Allergies  Allergen Reactions   Bacitracin Other (See Comments)    Blisters and pustules    Facility-Administered Medications Prior to Admission  Medication Dose Route Frequency Provider Last Rate Last Admin   levonorgestrel  (MIRENA ) 20 MCG/DAY IUD 1 each  1 each Intrauterine Once         Medications Prior to Admission  Medication Sig Dispense Refill   busPIRone  (BUSPAR ) 30 MG tablet Take 1/2 tablet (15 mg total) by mouth 3 (three) times daily. Max of 1.5 tablets daily. 135 tablet 1   Calcium Carbonate (CALCIUM 500 PO) Take 1 tablet by mouth daily.     clonazePAM  (KLONOPIN ) 0.5 MG tablet Take 1 tablet (0.5 mg total) by mouth 2 (two) times daily as needed for anxiety 30 tablet 0   DULoxetine  (CYMBALTA ) 60 MG capsule Take 1 capsule (60 mg total) by mouth in the morning. 90 capsule 1   lamoTRIgine  (LAMICTAL ) 100 MG tablet Take 1 tablet (100 mg total) by mouth 2 (two) times daily (in the morning and evening). 180 tablet 1   Multiple Vitamin (MULTIVITAMIN) tablet Take 1 tablet by mouth daily.     REXULTI  0.5 MG TABS Take 1 tablet (0.5 mg total) by mouth daily. 30 tablet 1   tirzepatide  (ZEPBOUND ) 5 MG/0.5ML Pen Inject 5 mg into the skin once a week. 2 mL 1   VITAMIN D , CHOLECALCIFEROL, PO Take 1,000 Units by mouth daily.     gabapentin  (NEURONTIN ) 100 MG capsule Take 1 capsule (100 mg total) by mouth 3 (three) times daily as needed. 90 capsule 0   hydrOXYzine  (ATARAX ) 10 MG  tablet Take 2 tablets (20 mg total) by mouth 2 (two) times daily as needed for anxiety. 120 tablet 1   levonorgestrel  (KYLEENA ) 19.5 MG IUD by Intrauterine route. Placed July 2018, needs to be removed 2023.      Results for orders placed or performed during the hospital encounter of 03/10/24 (from the past 48 hours)  Pregnancy, urine POC     Status: None   Collection Time: 03/10/24  8:04 AM  Result Value Ref Range   Preg Test, Ur NEGATIVE NEGATIVE    Comment:        THE SENSITIVITY OF THIS METHODOLOGY IS >20 mIU/mL.    No results found.  Review of Systems  Musculoskeletal:  Positive for arthralgias.  All other systems reviewed and are negative.   Blood pressure 131/87, pulse 86, temperature 98.6 F (37 C), temperature source Temporal, resp. rate 18, height 5' 9 (1.753 m), weight 103.6 kg, SpO2  100%. Physical Exam Vitals reviewed.  HENT:     Head: Normocephalic.     Nose: Nose normal.     Mouth/Throat:     Mouth: Mucous membranes are moist.  Eyes:     Pupils: Pupils are equal, round, and reactive to light.  Cardiovascular:     Rate and Rhythm: Normal rate.     Pulses: Normal pulses.  Pulmonary:     Effort: Pulmonary effort is normal.  Abdominal:     General: Abdomen is flat.  Musculoskeletal:     Cervical back: Normal range of motion.  Skin:    General: Skin is warm.     Capillary Refill: Capillary refill takes less than 2 seconds.  Neurological:     General: No focal deficit present.     Mental Status: She is alert.  Psychiatric:        Mood and Affect: Mood normal.      Ortho exam demonstrates tenderness to palpation of the right A1 pulley.  Patient does have full composite range of motion of the MCP PIP and DIP joints in all fingers of the right hand.  Fingers are perfused and sensate.  No cystic structures palpated around the A1 pulley of the third finger.  Assessment/Plan  Impression is symptomatic right third trigger finger.  Would not be in favor of another injection because of the risk of tendon rupture.  I think if she can live with this that would be 1 option and the other option is surgical release.  Risk and benefits of surgical release are discussed with the patient including but limited to infection nerve damage as well as incomplete pain relief.  The rehab process is also discussed.  In general and have her in a waterproof dressing until the sutures are removed and have her start doing finger range of motion exercises on the day of surgery.  Patient understands the risk and benefits and wishes to proceed.  All questions answered   KANDICE Glendia Hutchinson, MD 03/10/2024, 8:37 AM

## 2024-03-10 NOTE — Anesthesia Procedure Notes (Signed)
 Anesthesia Regional Block: Supraclavicular block   Pre-Anesthetic Checklist: , timeout performed,  Correct Patient, Correct Site, Correct Laterality,  Correct Procedure, Correct Position, site marked,  Risks and benefits discussed,  Surgical consent,  Pre-op evaluation,  At surgeon's request and post-op pain management  Laterality: Upper and Right  Prep: chloraprep       Needles:  Injection technique: Single-shot  Needle Type: Echogenic Needle     Needle Length: 5cm  Needle Gauge: 21     Additional Needles:   Procedures:,,,, ultrasound used (permanent image in chart),,     Nerve Stimulator or Paresthesia:   Additional Responses:  Block tested.  Patient tolerated procedure well Narrative:  Start time: 03/10/2024 8:46 AM End time: 03/10/2024 8:51 AM Injection made incrementally with aspirations every 5 mL.  Performed by: Personally  Anesthesiologist: Jefm Garnette LABOR, MD  Additional Notes: Block tested. Patient tolerated procedure well.

## 2024-03-10 NOTE — Transfer of Care (Signed)
 Immediate Anesthesia Transfer of Care Note  Patient: Carolyn Gilmore  Procedure(s) Performed: Procedure(s) (LRB): RELEASE, A1 PULLEY, FOR TRIGGER FINGER (Right)  Patient Location: PACU  Anesthesia Type: MAC  Level of Consciousness: awake, alert , oriented and patient cooperative  Airway & Oxygen Therapy: Patient Spontanous Breathing and Patient connected to face mask oxygen  Post-op Assessment: Report given to PACU RN and Post -op Vital signs reviewed and stable  Post vital signs: Reviewed and stable  Complications: No apparent anesthesia complications Last Vitals:  Vitals Value Taken Time  BP    Temp    Pulse 86 03/10/24 09:51  Resp 21 03/10/24 09:51  SpO2 91 % 03/10/24 09:51  Vitals shown include unfiled device data.  Last Pain:  Vitals:   03/10/24 0806  TempSrc: Temporal  PainSc: 0-No pain      Patients Stated Pain Goal: 0 (03/10/24 0806)  Complications: No notable events documented.

## 2024-03-11 ENCOUNTER — Encounter (HOSPITAL_BASED_OUTPATIENT_CLINIC_OR_DEPARTMENT_OTHER): Payer: Self-pay | Admitting: Orthopedic Surgery

## 2024-03-13 ENCOUNTER — Other Ambulatory Visit (HOSPITAL_COMMUNITY): Payer: Self-pay

## 2024-03-13 ENCOUNTER — Ambulatory Visit: Admitting: Physician Assistant

## 2024-03-14 ENCOUNTER — Ambulatory Visit
Admission: RE | Admit: 2024-03-14 | Discharge: 2024-03-14 | Disposition: A | Discharge status: Home / Self Care | Source: Ambulatory Visit | Attending: Obstetrics and Gynecology

## 2024-03-14 DIAGNOSIS — Z1231 Encounter for screening mammogram for malignant neoplasm of breast: Secondary | ICD-10-CM

## 2024-03-15 ENCOUNTER — Ambulatory Visit: Payer: Self-pay | Admitting: Obstetrics and Gynecology

## 2024-03-15 ENCOUNTER — Encounter: Admitting: Physician Assistant

## 2024-03-17 ENCOUNTER — Other Ambulatory Visit (HOSPITAL_COMMUNITY): Payer: Self-pay

## 2024-03-17 ENCOUNTER — Ambulatory Visit (INDEPENDENT_AMBULATORY_CARE_PROVIDER_SITE_OTHER): Admitting: Surgical

## 2024-03-17 DIAGNOSIS — M65331 Trigger finger, right middle finger: Secondary | ICD-10-CM

## 2024-03-18 ENCOUNTER — Encounter: Payer: Self-pay | Admitting: Surgical

## 2024-03-18 NOTE — Progress Notes (Signed)
 Post-Op Visit Note   Patient: Carolyn Gilmore           Date of Birth: 1980/08/26           MRN: [id number] Visit Date: 03/17/2024 PCP: Job Lukes, PA   Assessment & Plan:  Chief Complaint:  Chief Complaint  Patient presents with   Right Hand - Routine Post Op    03/10/2024 right 3rd finger trigger finger release   Visit Diagnoses: No diagnosis found.  Plan: Patient is a 43 year old female who presents s/p right hand trigger finger release of the long finger.  This was done on 03/10/2024.  Overall she is doing well.  No complaints.  She has no pain.  Has a little bit of swelling some stiffness in the digit but otherwise she really has no symptoms.  No fevers or chills.  She has admitted to playing some pickleball since surgery.  On exam, patient has intact extension and flexion of the digit actively with full range of motion compared with other fingers.  No cellulitis or skin changes.  Sutures intact.  No evidence of infection or dehiscence.  2+ radial pulse of the operative extremity.  No triggering observed.  Sutures removed and replaced with Steri-Strips.  There is no residual dehiscence of the incision after suture removal.  She lacks maybe 2 to 3 degrees of full extension compared with contralateral long finger.  Plan at this time is activity modification to avoid any lifting more than the weight of a cell phone with this hand.  Discouraged patient from further pickleball activities to help avoid incision dehiscence..  Steri-Strips placed today and we will see her back in 3 weeks for clinical recheck regarding her incision.  Follow-Up Instructions: No follow-ups on file.   Orders:  No orders of the defined types were placed in this encounter.  No orders of the defined types were placed in this encounter.   Imaging: No results found.  PMFS History: Patient Active Problem List   Diagnosis Date Noted   Chronic rhinitis 12/15/2021   Effusion, left knee 12/22/2018    Melanoma (HCC)    IBS (irritable bowel syndrome)    Depression    Anxiety    Melanoma of thigh, right (HCC) 06/29/2014   Vitamin D  deficiency 06/29/2013   Vitamin B 12 deficiency 06/29/2013   Past Medical History:  Diagnosis Date   Anxiety    Depression    Elevated liver enzymes 10/09/2016   RUQ ultrasound -- showed some gallstones   Genital warts    HPV in female    Hx of gallstones 10/2016   IBS (irritable bowel syndrome)    Melanoma (HCC)    R thigh   Melanoma of thigh, right (HCC) 2016   PCOS (polycystic ovarian syndrome)    Vitamin B 12 deficiency 2015   Vitamin D  deficiency 2015    Family History  Problem Relation Age of Onset   Hypertension Mother    Atrial fibrillation Mother    Hypertension Father    Hyperlipidemia Father    Diabetes Brother    Breast cancer Neg Hx     Past Surgical History:  Procedure Laterality Date   CHOLECYSTECTOMY N/A 03/19/2017   Procedure: LAPAROSCOPIC CHOLECYSTECTOMY;  Surgeon: Signe Mitzie LABOR, MD;  Location: [facility] OR;  Service: General;  Laterality: N/A;   MOLE REMOVAL N/A 2016   Right thigh, Melanoma  in office   TRIGGER FINGER RELEASE Right 03/10/2024   Procedure: RELEASE, A1 PULLEY, FOR TRIGGER  FINGER;  Surgeon: Addie Cordella Hamilton, MD;  Location: [facility];  Service: Orthopedics;  Laterality: Right;   WISDOM TOOTH EXTRACTION     Social History   Occupational History   Not on file  Tobacco Use   Smoking status: Never   Smokeless tobacco: Never  Vaping Use   Vaping status: Never Used  Substance and Sexual Activity   Alcohol use: Yes    Comment: social   Drug use: No   Sexual activity: Yes    Partners: Male    Birth control/protection: I.U.D.

## 2024-03-19 DIAGNOSIS — M65331 Trigger finger, right middle finger: Secondary | ICD-10-CM

## 2024-04-02 ENCOUNTER — Other Ambulatory Visit (HOSPITAL_COMMUNITY): Payer: Self-pay

## 2024-04-03 ENCOUNTER — Other Ambulatory Visit: Payer: Self-pay

## 2024-04-05 ENCOUNTER — Other Ambulatory Visit (HOSPITAL_COMMUNITY): Payer: Self-pay

## 2024-04-05 ENCOUNTER — Encounter: Payer: Self-pay | Admitting: Physician Assistant

## 2024-04-05 ENCOUNTER — Telehealth (HOSPITAL_COMMUNITY): Payer: Self-pay

## 2024-04-05 ENCOUNTER — Ambulatory Visit (INDEPENDENT_AMBULATORY_CARE_PROVIDER_SITE_OTHER): Admitting: Physician Assistant

## 2024-04-05 ENCOUNTER — Encounter: Admitting: Physician Assistant

## 2024-04-05 VITALS — BP 120/80 | HR 80 | Temp 98.2°F | Ht 69.0 in | Wt 233.5 lb

## 2024-04-05 DIAGNOSIS — E559 Vitamin D deficiency, unspecified: Secondary | ICD-10-CM

## 2024-04-05 DIAGNOSIS — E538 Deficiency of other specified B group vitamins: Secondary | ICD-10-CM | POA: Diagnosis not present

## 2024-04-05 DIAGNOSIS — F419 Anxiety disorder, unspecified: Secondary | ICD-10-CM

## 2024-04-05 DIAGNOSIS — E669 Obesity, unspecified: Secondary | ICD-10-CM | POA: Diagnosis not present

## 2024-04-05 DIAGNOSIS — Z23 Encounter for immunization: Secondary | ICD-10-CM

## 2024-04-05 DIAGNOSIS — E88819 Insulin resistance, unspecified: Secondary | ICD-10-CM | POA: Diagnosis not present

## 2024-04-05 DIAGNOSIS — Z0000 Encounter for general adult medical examination without abnormal findings: Secondary | ICD-10-CM

## 2024-04-05 LAB — COMPREHENSIVE METABOLIC PANEL WITH GFR
ALT: 49 U/L — ABNORMAL HIGH (ref 0–35)
AST: 28 U/L (ref 0–37)
Albumin: 4.7 g/dL (ref 3.5–5.2)
Alkaline Phosphatase: 83 U/L (ref 39–117)
BUN: 13 mg/dL (ref 6–23)
CO2: 28 meq/L (ref 19–32)
Calcium: 9.4 mg/dL (ref 8.4–10.5)
Chloride: 102 meq/L (ref 96–112)
Creatinine, Ser: 0.65 mg/dL (ref 0.40–1.20)
GFR: 108.85 mL/min (ref >60.00)
Glucose, Bld: 103 mg/dL — ABNORMAL HIGH (ref 70–99)
Potassium: 4.4 meq/L (ref 3.5–5.1)
Sodium: 138 meq/L (ref 135–145)
Total Bilirubin: 0.6 mg/dL (ref 0.2–1.2)
Total Protein: 6.9 g/dL (ref 6.0–8.3)

## 2024-04-05 LAB — HEMOGLOBIN A1C: Hgb A1c MFr Bld: 5.9 % (ref 4.6–6.5)

## 2024-04-05 LAB — CBC WITH DIFFERENTIAL/PLATELET
Basophils Absolute: 0 K/uL (ref 0.0–0.1)
Basophils Relative: 0.8 % (ref 0.0–3.0)
Eosinophils Absolute: 0.3 K/uL (ref 0.0–0.7)
Eosinophils Relative: 5.1 % — ABNORMAL HIGH (ref 0.0–5.0)
HCT: 40.4 % (ref 36.0–46.0)
Hemoglobin: 13.6 g/dL (ref 12.0–15.0)
Lymphocytes Relative: 25.3 % (ref 12.0–46.0)
Lymphs Abs: 1.5 K/uL (ref 0.7–4.0)
MCHC: 33.8 g/dL (ref 30.0–36.0)
MCV: 87.5 fl (ref 78.0–100.0)
Monocytes Absolute: 0.5 K/uL (ref 0.1–1.0)
Monocytes Relative: 8.3 % (ref 3.0–12.0)
Neutro Abs: 3.6 K/uL (ref 1.4–7.7)
Neutrophils Relative %: 60.5 % (ref 43.0–77.0)
Platelets: 283 K/uL (ref 150.0–400.0)
RBC: 4.62 Mil/uL (ref 3.87–5.11)
RDW: 13.1 % (ref 11.5–15.5)
WBC: 6 K/uL (ref 4.0–10.5)

## 2024-04-05 LAB — LIPID PANEL
Cholesterol: 214 mg/dL — ABNORMAL HIGH (ref 0–200)
HDL: 69.8 mg/dL (ref >39.00)
LDL Cholesterol: 129 mg/dL — ABNORMAL HIGH (ref 0–99)
NonHDL: 144.38
Total CHOL/HDL Ratio: 3
Triglycerides: 78 mg/dL (ref 0.0–149.0)
VLDL: 15.6 mg/dL (ref 0.0–40.0)

## 2024-04-05 LAB — VITAMIN D 25 HYDROXY (VIT D DEFICIENCY, FRACTURES): VITD: 47.91 ng/mL (ref 30.00–100.00)

## 2024-04-05 LAB — VITAMIN B12: Vitamin B-12: 984 pg/mL — ABNORMAL HIGH (ref 211–911)

## 2024-04-05 MED ORDER — WEGOVY 0.5 MG/0.5ML ~~LOC~~ SOAJ
0.5000 mg | SUBCUTANEOUS | 1 refills | Status: AC
  Filled 2024-04-05 – 2024-04-20 (×3): qty 2, 28d supply, fill #0

## 2024-04-05 NOTE — Progress Notes (Signed)
 Subjective:    Carolyn Gilmore is a 43 y.o. female and is here for a comprehensive physical exam.  HPI  There are no preventive care reminders to display for this patient.  Discussed the use of AI scribe software for clinical note transcription with the patient, who gave verbal consent to proceed.  History of Present Illness Carolyn Gilmore is a 43 year old female who presents for medication management and follow-up for anxiety and weight management issues.  Her anxiety symptoms have significantly improved with Rexulti , which she describes as a 'Secretary/administrator.' However, she faces challenges with weight management on Zepbound . She finds the vials less effective than the pens, having regained 25 pounds after starting Rexulti . She is currently on 5 mg of Zepbound  vials but feels they are not effective.  She recently underwent trigger finger surgery three weeks ago and experiences persistent pain and limited finger movement. She had previously received injections for this condition.  She quit drinking alcohol when she started Zepbound  to avoid extra calories. Her husband is struggling with alcohol consumption, and she has been exploring medication options like naltrexone, which has been difficult to obtain due to insurance issues.  No hives, swelling in her legs, or digestive issues. No tingling, tremor, or pain aside from her post-surgical finger pain.    Health Maintenance: Immunizations -- UpToDate; flu shot received today Colonoscopy -- n/a Mammogram -- UpToDate  PAP -- UpToDate  Bone Density -- N/A  Diet -- overall healthy Exercise -- as able  Sleep habits -- no major concerns Mood -- stable  UTD with dentist? - yes UTD with eye doctor? - yes  Weight history: Wt Readings from Last 10 Encounters:  04/05/24 233 lb 8 oz (105.9 kg)  03/10/24 228 lb 6.3 oz (103.6 kg)  03/08/23 258 lb (117 kg)  11/09/22 260 lb 3.2 oz (118 kg)  12/15/21 273 lb 4 oz (123.9 kg)  03/14/21 259 lb  6.1 oz (117.7 kg)  03/13/20 258 lb (117 kg)  02/14/20 266 lb 8 oz (120.9 kg)  12/16/18 251 lb 4 oz (114 kg)  02/04/18 226 lb 6.4 oz (102.7 kg)   Body mass index is 34.48 kg/m. No LMP recorded. (Menstrual status: IUD).  Alcohol use:  reports current alcohol use.  Tobacco use:  Tobacco Use: Low Risk  (04/05/2024)   Patient History    Smoking Tobacco Use: Never    Smokeless Tobacco Use: Never    Passive Exposure: Not on file   Eligible for lung cancer screening? no     04/05/2024   11:13 AM  Depression screen PHQ 2/9  Decreased Interest 0  Down, Depressed, Hopeless 0  PHQ - 2 Score 0  Altered sleeping 0  Tired, decreased energy 0  Change in appetite 0  Feeling bad or failure about yourself  0  Trouble concentrating 0  Moving slowly or fidgety/restless 0  Suicidal thoughts 0  PHQ-9 Score 0  Difficult doing work/chores Not difficult at all     Other providers/specialists: Patient Care Team: Job Lukes, [place] as PCP - General (Physician Assistant)    PMHx, SurgHx, SocialHx, Medications, and Allergies were reviewed in the Visit Navigator and updated as appropriate.   Past Medical History:  Diagnosis Date   Anxiety    Depression    Elevated liver enzymes 10/09/2016   RUQ ultrasound -- showed some gallstones   Genital warts    HPV in female    Hx of gallstones 10/2016   IBS (  irritable bowel syndrome)    Melanoma (HCC)    R thigh   Melanoma of thigh, right (HCC) 2016   PCOS (polycystic ovarian syndrome)    Vitamin B 12 deficiency 2015   Vitamin D  deficiency 2015     Past Surgical History:  Procedure Laterality Date   CHOLECYSTECTOMY N/A 03/19/2017   Procedure: LAPAROSCOPIC CHOLECYSTECTOMY;  Surgeon: Signe Mitzie LABOR, MD;  Location: [facility] OR;  Service: General;  Laterality: N/A;   MOLE REMOVAL N/A 2016   Right thigh, Melanoma  in office   TRIGGER FINGER RELEASE Right 03/10/2024   Procedure: RELEASE, A1 PULLEY, FOR TRIGGER FINGER;  Surgeon: Addie Cordella Hamilton, MD;  Location: [facility];  Service: Orthopedics;  Laterality: Right;   WISDOM TOOTH EXTRACTION       Family History  Problem Relation Age of Onset   Hypertension Mother    Atrial fibrillation Mother    Hypertension Father    Hyperlipidemia Father    Diabetes Brother    Breast cancer Neg Hx     Social History   Tobacco Use   Smoking status: Never   Smokeless tobacco: Never  Vaping Use   Vaping status: Never Used  Substance Use Topics   Alcohol use: Yes    Comment: social   Drug use: No    Review of Systems:   ROS  Objective:   BP 120/80 (BP Location: Left Arm, Patient Position: Sitting, Cuff Size: Large)   Pulse 80   Temp 98.2 F (36.8 C) (Temporal)   Ht 5' 9 (1.753 m)   Wt 233 lb 8 oz (105.9 kg)   SpO2 98%   BMI 34.48 kg/m  Body mass index is 34.48 kg/m.   General Appearance:    Alert, cooperative, no distress, appears stated age  Head:    Normocephalic, without obvious abnormality, atraumatic  Eyes:    PERRL, conjunctiva/corneas clear, EOM's intact, fundi    benign, both eyes  Ears:    Normal TM's and external ear canals, both ears  Nose:   Nares normal, septum midline, mucosa normal, no drainage    or sinus tenderness  Throat:   Lips, mucosa, and tongue normal; teeth and gums normal  Neck:   Supple, symmetrical, trachea midline, no adenopathy;    thyroid :  no enlargement/tenderness/nodules; no carotid   bruit or JVD  Back:     Symmetric, no curvature, ROM normal, no CVA tenderness  Lungs:     Clear to auscultation bilaterally, respirations unlabored  Chest Wall:    No tenderness or deformity   Heart:    Regular rate and rhythm, S1 and S2 normal, no murmur, rub or gallop  Breast Exam:    Deferred   Abdomen:     Soft, non-tender, bowel sounds active all four quadrants,    no masses, no organomegaly  Genitalia:    Deferred   Extremities:   Extremities normal, atraumatic, no cyanosis or edema  Pulses:   2+ and symmetric all  extremities  Skin:   Skin color, texture, turgor normal, no rashes or lesions  Lymph nodes:   Cervical, supraclavicular, and axillary nodes normal  Neurologic:   CNII-XII intact, normal strength, sensation and reflexes    throughout    Assessment/Plan:   Assessment and Plan Assessment & Plan General Health Maintenance Up to date with dental, eye, and dermatology visits. Has not had a colonoscopy since high school, which was performed due to rectal bleeding and diagnosed as hemorrhoids. Quit alcohol  consumption since starting Zepbound  to avoid extra calories. Scheduled to receive a flu shot. - Administer flu shot. - Order CBC, CMP, lipid panel, and A1c.  Obesity Weight gain of 25 pounds after switching from Zepbound  pens to vials. Considering Wegovy  pens due to perceived ineffectiveness of vials. Apprehensive about increasing dose due to anxiety and nausea. Discussed cost considerations and potential alternatives including Ozempic  self-pay pen for microdosing. - Prescribe Wegovy  0.5 mg pen to [facility] pharmacy. - Consider switching to Novo Nordisk pharmacy if cost exceeds $599. - Discuss potential use of Ozempic  self-pay pen for microdosing.  Anxiety Improved with current medication regimen, including Rexulti . Experiences anxiety intermittently, often related to nausea from medication. Continue close follow up with psychiatry for ongoing management of symptom(s)   Vitamin D  deficiency Low vitamin D  levels and requests monitoring. - Order vitamin D  level.  Vitamin B12 deficiency Recommend B12 supplementation       Arvis Miguez, PA-C [facility]

## 2024-04-06 ENCOUNTER — Ambulatory Visit: Payer: Self-pay | Admitting: Physician Assistant

## 2024-04-06 ENCOUNTER — Other Ambulatory Visit (HOSPITAL_COMMUNITY): Payer: Self-pay

## 2024-04-06 ENCOUNTER — Telehealth (HOSPITAL_COMMUNITY): Payer: Self-pay

## 2024-04-06 NOTE — Telephone Encounter (Signed)
 Pharmacy Patient Advocate Encounter   Received notification from Pt Calls Messages that prior authorization for Wegovy  0.5MG /0.5ML auto-injectors  is required/requested.   Insurance verification completed.   The patient is insured through [facility] ADVANTAGE/RX ADVANCE.   Per test claim: PA required; PA submitted to above mentioned insurance via Latent Key/confirmation #/EOC [id number] Status is pending

## 2024-04-06 NOTE — Telephone Encounter (Signed)
 PA request has been Received. New Encounter has been or will be created for follow up. For additional info see Pharmacy Prior Auth telephone encounter from 04/06/24.

## 2024-04-07 ENCOUNTER — Other Ambulatory Visit (HOSPITAL_COMMUNITY): Payer: Self-pay

## 2024-04-10 ENCOUNTER — Other Ambulatory Visit: Payer: Self-pay | Admitting: Physician Assistant

## 2024-04-10 ENCOUNTER — Encounter: Admitting: Surgical

## 2024-04-10 DIAGNOSIS — R7989 Other specified abnormal findings of blood chemistry: Secondary | ICD-10-CM

## 2024-04-12 ENCOUNTER — Other Ambulatory Visit (HOSPITAL_COMMUNITY): Payer: Self-pay

## 2024-04-12 MED ORDER — REXULTI 0.5 MG PO TABS
0.5000 mg | ORAL_TABLET | Freq: Every day | ORAL | 1 refills | Status: AC
  Filled 2024-05-14: qty 90, 90d supply, fill #0
  Filled 2024-07-24: qty 90, 90d supply, fill #1

## 2024-04-12 MED ORDER — BUSPIRONE HCL 30 MG PO TABS
15.0000 mg | ORAL_TABLET | Freq: Three times a day (TID) | ORAL | 0 refills | Status: AC
  Filled 2024-04-12 – 2024-07-24 (×2): qty 135, 90d supply, fill #0

## 2024-04-12 MED ORDER — DULOXETINE HCL 60 MG PO CPEP
60.0000 mg | ORAL_CAPSULE | Freq: Every day | ORAL | 0 refills | Status: DC
  Filled 2024-04-12 – 2024-05-14 (×2): qty 90, 90d supply, fill #0

## 2024-04-12 MED ORDER — LAMOTRIGINE 100 MG PO TABS: 100.0000 mg | ORAL_TABLET | Freq: Two times a day (BID) | ORAL | 1 refills | Status: AC

## 2024-04-13 ENCOUNTER — Encounter: Payer: Self-pay | Admitting: Physician Assistant

## 2024-04-13 NOTE — Telephone Encounter (Signed)
 Pharmacy Patient Advocate Encounter  Received notification from [employer] that Prior Authorization for Wegovy  0.5mg /0.54ml has been DENIED.  Full denial letter will be uploaded to the media tab. See denial reason below.   PA #/Case ID/Reference #: [id number]

## 2024-04-13 NOTE — Telephone Encounter (Signed)
 Carolyn Gilmore, see message. Wegovy  denied.

## 2024-04-14 NOTE — Telephone Encounter (Signed)
 Spoke to pt told her calling to confirm if she is paying out of pocket for Wegovy ? Pt said yes, and the pharmacy called her and she told them and it is ready for pickup. Told her okay. Have a good day.

## 2024-04-20 ENCOUNTER — Other Ambulatory Visit (HOSPITAL_COMMUNITY): Payer: Self-pay

## 2024-04-21 ENCOUNTER — Other Ambulatory Visit (HOSPITAL_COMMUNITY)
Admission: RE | Admit: 2024-04-21 | Discharge: 2024-04-21 | Disposition: A | Discharge status: Home / Self Care | Source: Ambulatory Visit | Attending: Obstetrics and Gynecology | Admitting: Obstetrics and Gynecology

## 2024-04-21 ENCOUNTER — Encounter: Payer: Self-pay | Admitting: Obstetrics and Gynecology

## 2024-04-21 ENCOUNTER — Ambulatory Visit (INDEPENDENT_AMBULATORY_CARE_PROVIDER_SITE_OTHER): Payer: Self-pay | Admitting: Obstetrics and Gynecology

## 2024-04-21 VITALS — BP 112/68 | HR 83 | Ht 69.0 in | Wt 232.2 lb

## 2024-04-21 DIAGNOSIS — Z01419 Encounter for gynecological examination (general) (routine) without abnormal findings: Secondary | ICD-10-CM

## 2024-04-21 DIAGNOSIS — Z1331 Encounter for screening for depression: Secondary | ICD-10-CM

## 2024-04-21 DIAGNOSIS — Z3009 Encounter for other general counseling and advice on contraception: Secondary | ICD-10-CM

## 2024-04-21 DIAGNOSIS — N898 Other specified noninflammatory disorders of vagina: Secondary | ICD-10-CM | POA: Diagnosis not present

## 2024-04-21 DIAGNOSIS — Z23 Encounter for immunization: Secondary | ICD-10-CM | POA: Diagnosis not present

## 2024-04-21 MED ORDER — LEVONORGESTREL 20 MCG/DAY IU IUD
1.0000 | INTRAUTERINE_SYSTEM | Freq: Once | INTRAUTERINE | Status: AC
Start: 2024-04-21 — End: ?

## 2024-04-21 NOTE — Progress Notes (Signed)
 43 y.o. y.o. female here for established annual exam. No LMP recorded. (Menstrual status: IUD).   Pap smear: denies abnormal Pap: 2025 sent Gardesil:none. Counseling done and would like to receive. #1 of 3 given today Colon: none to begin at age 44. No family history  Mirena  IUD since 2018 Having more spotting and heavier bleeding. Would like to remove and place a new mirena  IUD Body mass index is 34.29 kg/m.     04/21/2024    8:10 AM 04/05/2024   11:13 AM 11/09/2022    2:56 PM  Depression screen PHQ 2/9  Decreased Interest 0 0 1  Down, Depressed, Hopeless 0 0 1  PHQ - 2 Score 0 0 2  Altered sleeping  0 2  Tired, decreased energy  0 3  Change in appetite  0 2  Feeling bad or failure about yourself   0 0  Trouble concentrating  0 1  Moving slowly or fidgety/restless  0 0  Suicidal thoughts  0 0  PHQ-9 Score  0 10  Difficult doing work/chores  Not difficult at all Somewhat difficult    Blood pressure 112/68, pulse 83, height 5' 9 (1.753 m), weight 232 lb 3.2 oz (105.3 kg), SpO2 98%.     Component Value Date/Time   DIAGPAP  03/08/2023 1248    - Negative for intraepithelial lesion or malignancy (NILM)   ADEQPAP  03/08/2023 1248    Satisfactory for evaluation; transformation zone component PRESENT.    GYN HISTORY:    Component Value Date/Time   DIAGPAP  03/08/2023 1248    - Negative for intraepithelial lesion or malignancy (NILM)   ADEQPAP  03/08/2023 1248    Satisfactory for evaluation; transformation zone component PRESENT.    OB History  Gravida Para Term Preterm AB Living  1 1 1   1   SAB IAB Ectopic Multiple Live Births      1    # Outcome Date GA Lbr Len/2nd Weight Sex Type Anes PTL Lv  1 Term             Past Medical History:  Diagnosis Date   Anxiety    Depression    Elevated liver enzymes 10/09/2016   RUQ ultrasound -- showed some gallstones   Genital warts    HPV in female    Hx of gallstones 10/2016   IBS (irritable bowel syndrome)     Melanoma (HCC)    R thigh   Melanoma of thigh, right (HCC) 2016   PCOS (polycystic ovarian syndrome)    Vitamin B 12 deficiency 2015   Vitamin D  deficiency 2015    Past Surgical History:  Procedure Laterality Date   CHOLECYSTECTOMY N/A 03/19/2017   Procedure: LAPAROSCOPIC CHOLECYSTECTOMY;  Surgeon: Signe Mitzie LABOR, MD;  Location: [facility] OR;  Service: General;  Laterality: N/A;   MOLE REMOVAL N/A 2016   Right thigh, Melanoma  in office   TRIGGER FINGER RELEASE Right 03/10/2024   Procedure: RELEASE, A1 PULLEY, FOR TRIGGER FINGER;  Surgeon: Addie Cordella Hamilton, MD;  Location: [facility];  Service: Orthopedics;  Laterality: Right;   WISDOM TOOTH EXTRACTION      Current Outpatient Medications on File Prior to Visit  Medication Sig Dispense Refill   busPIRone  (BUSPAR ) 30 MG tablet Take 1/2 tablet (15 mg total) by mouth 3 (three) times daily. 135 tablet 0   Calcium Carbonate (CALCIUM 500 PO) Take 1 tablet by mouth daily.     clonazePAM  (KLONOPIN ) 0.5 MG tablet  Take 1 tablet (0.5 mg total) by mouth 2 (two) times daily as needed for anxiety 30 tablet 0   DULoxetine  (CYMBALTA ) 60 MG capsule Take 1 capsule (60 mg total) by mouth in the morning. 90 capsule 1   DULoxetine  (CYMBALTA ) 60 MG capsule Take 1 capsule by mouth once a day 90 capsule 0   hydrOXYzine  (ATARAX ) 10 MG tablet Take 2 tablets (20 mg total) by mouth 2 (two) times daily as needed for anxiety. 120 tablet 1   lamoTRIgine  (LAMICTAL ) 100 MG tablet Take 1 tablet (100 mg total) by mouth 2 (two) times daily (in the morning and evening). 180 tablet 1   lamoTRIgine  (LAMICTAL ) 100 MG tablet Take 1 tablet (100 mg total) by mouth 2 (two) times daily in the morning and in the evening. 180 tablet 1   Multiple Vitamin (MULTIVITAMIN) tablet Take 1 tablet by mouth daily.     REXULTI  0.5 MG TABS Take 1 tablet (0.5 mg total) by mouth daily. 90 tablet 1   semaglutide -weight management (WEGOVY ) 0.5 MG/0.5ML SOAJ SQ injection Inject 0.5 mg  into the skin once a week. 2 mL 1   VITAMIN D , CHOLECALCIFEROL, PO Take 1,000 Units by mouth daily.     Current Facility-Administered Medications on File Prior to Visit  Medication Dose Route Frequency Provider Last Rate Last Admin   levonorgestrel  (MIRENA ) 20 MCG/DAY IUD 1 each  1 each Intrauterine Once         Social History   Socioeconomic History   Marital status: Married    Spouse name: Not on file   Number of children: Not on file   Years of education: Not on file   Highest education level: Not on file  Occupational History   Not on file  Tobacco Use   Smoking status: Never   Smokeless tobacco: Never  Vaping Use   Vaping status: Never Used  Substance and Sexual Activity   Alcohol use: Yes    Comment: social   Drug use: No   Sexual activity: Yes    Partners: Male    Birth control/protection: I.U.D.  Other Topics Concern   Not on file  Social History Narrative   Was living in [place] in 15-16 years   Now moving here to be close to dad   Married with 1 son   Social Drivers of Corporate investment banker Strain: Not on file  Food Insecurity: Not on file  Transportation Needs: Not on file  Physical Activity: Not on file  Stress: Not on file  Social Connections: Not on file  Intimate Partner Violence: Not on file    Family History  Problem Relation Age of Onset   Hypertension Mother    Atrial fibrillation Mother    Hypertension Father    Hyperlipidemia Father    Diabetes Brother    Breast cancer Neg Hx      Allergies  Allergen Reactions   Bacitracin Other (See Comments)    Blisters and pustules      Patient's last menstrual period was No LMP recorded. (Menstrual status: IUD)..            Review of Systems Alls systems reviewed and are negative.     Physical Exam Constitutional:      Appearance: Normal appearance.  Genitourinary:     Vulva and urethral meatus normal.     No lesions in the vagina.     Genitourinary Comments: Possible irritation  from yeast infection. Swab collected  Right Labia: No rash, lesions or skin changes.    Left Labia: No lesions, skin changes or rash.       No vaginal discharge or tenderness.     No vaginal prolapse present.    No vaginal atrophy present.     Right Adnexa: not tender, not palpable and no mass present.    Left Adnexa: not tender, not palpable and no mass present.    No cervical motion tenderness or discharge.     IUD strings visualized.        Uterus is not enlarged, tender or irregular.  Breasts:    Right: Normal.     Left: Normal.  HENT:     Head: Normocephalic.  Neck:     Thyroid : No thyroid  mass, thyromegaly or thyroid  tenderness.  Cardiovascular:     Rate and Rhythm: Normal rate and regular rhythm.     Heart sounds: Normal heart sounds, S1 normal and S2 normal.  Pulmonary:     Effort: Pulmonary effort is normal.     Breath sounds: Normal breath sounds and air entry.  Abdominal:     General: There is no distension.     Palpations: Abdomen is soft. There is no mass.     Tenderness: There is no abdominal tenderness. There is no guarding or rebound.  Musculoskeletal:        General: Normal range of motion.     Cervical back: Full passive range of motion without pain, normal range of motion and neck supple. No tenderness.     Right lower leg: No edema.     Left lower leg: No edema.  Neurological:     Mental Status: She is alert.  Skin:    General: Skin is warm.  Psychiatric:        Mood and Affect: Mood normal.        Behavior: Behavior normal.        Thought Content: Thought content normal.  Vitals and nursing note reviewed. Exam conducted with a chaperone present.       A:         Well Woman GYN exam Mirena  IUD in place with more noticeable bleeding and would like to remove and place new one                              P:        Pap smear collected today Encouraged annual mammogram screening Colon cancer screening not indicated DXA not  indicated Labs and immunizations to do with PMD Discussed breast self examss/s of perimenopause. Encouraged healthy lifestyle practices Encouraged Vit D and Calcium  Return for mirena  IUD removal and placement Gardesil #1 of 3 given today.   No follow-ups on file.  Carolyn Gilmore

## 2024-04-21 NOTE — Patient Instructions (Addendum)
 Perimenopause/Menopause suggestions   You should be getting 1,200 mg of calcium a day between your diet and supplements and at least 3000 IU a day of Vit D. You should exercise regularly with weight bearing exercises. I would recommend a bone density q 2 years.  We loose 5% per year in this time period of our bone density, due to the loss of estrogen. Magnesium at bedtime for our sleep and bones (follow recommended dose on bottle)  HRT use with having a uterus must also use progesterone at the same time.  Please read The New Menopause my Dr. Ronal Stagger Haver  Try to avoid caffeine and alcohol in this period, as they can make symptoms worse  Continue annual mammograms, unless told sooner Mammogram screening starts at age 60  Take motrin an hour before mirean IUD removal and insertion Please reach out with any questions [name]

## 2024-04-22 LAB — SURESWAB® ADVANCED VAGINITIS PLUS,TMA
C. trachomatis RNA, TMA: NOT DETECTED
CANDIDA SPECIES: DETECTED — AB
Candida glabrata: NOT DETECTED
N. gonorrhoeae RNA, TMA: NOT DETECTED
SURESWAB(R) ADV BACTERIAL VAGINOSIS(BV),TMA: POSITIVE — AB
TRICHOMONAS VAGINALIS (TV),TMA: NOT DETECTED

## 2024-04-23 ENCOUNTER — Ambulatory Visit: Payer: Self-pay | Admitting: Obstetrics and Gynecology

## 2024-04-23 ENCOUNTER — Other Ambulatory Visit: Payer: Self-pay | Admitting: Obstetrics and Gynecology

## 2024-04-23 MED ORDER — METRONIDAZOLE 500 MG PO TABS
500.0000 mg | ORAL_TABLET | Freq: Two times a day (BID) | ORAL | 0 refills | Status: DC
  Filled 2024-04-23: qty 14, 7d supply, fill #0

## 2024-04-23 MED ORDER — FLUCONAZOLE 150 MG PO TABS
150.0000 mg | ORAL_TABLET | Freq: Once | ORAL | 0 refills | Status: AC
  Filled 2024-04-23: qty 1, 1d supply, fill #0

## 2024-04-24 ENCOUNTER — Other Ambulatory Visit (HOSPITAL_COMMUNITY): Payer: Self-pay

## 2024-04-24 ENCOUNTER — Ambulatory Visit
Admission: RE | Admit: 2024-04-24 | Discharge: 2024-04-24 | Disposition: A | Discharge status: Home / Self Care | Source: Ambulatory Visit | Attending: Physician Assistant | Admitting: Physician Assistant

## 2024-04-24 ENCOUNTER — Ambulatory Visit: Payer: Self-pay | Admitting: Physician Assistant

## 2024-04-24 DIAGNOSIS — R7989 Other specified abnormal findings of blood chemistry: Secondary | ICD-10-CM

## 2024-04-26 LAB — CYTOLOGY - PAP
Comment: NEGATIVE
Diagnosis: REACTIVE
High risk HPV: NEGATIVE

## 2024-05-01 ENCOUNTER — Encounter: Payer: Self-pay | Admitting: Radiology

## 2024-05-15 ENCOUNTER — Other Ambulatory Visit: Payer: Self-pay

## 2024-05-15 ENCOUNTER — Other Ambulatory Visit (HOSPITAL_COMMUNITY): Payer: Self-pay

## 2024-05-16 ENCOUNTER — Other Ambulatory Visit (HOSPITAL_COMMUNITY): Payer: Self-pay

## 2024-05-17 ENCOUNTER — Other Ambulatory Visit (HOSPITAL_COMMUNITY): Payer: Self-pay

## 2024-05-18 ENCOUNTER — Other Ambulatory Visit (HOSPITAL_COMMUNITY): Payer: Self-pay

## 2024-05-28 ENCOUNTER — Telehealth: Admitting: Family

## 2024-05-28 DIAGNOSIS — J069 Acute upper respiratory infection, unspecified: Secondary | ICD-10-CM | POA: Diagnosis not present

## 2024-05-28 MED ORDER — FLUTICASONE PROPIONATE 50 MCG/ACT NA SUSP: 2.0000 | Freq: Every day | NASAL | 6 refills | Status: AC

## 2024-05-28 MED ORDER — BENZONATATE 200 MG PO CAPS: 200.0000 mg | ORAL_CAPSULE | Freq: Two times a day (BID) | ORAL | 0 refills | Status: AC | PRN

## 2024-05-28 NOTE — Progress Notes (Signed)

## 2024-06-12 ENCOUNTER — Other Ambulatory Visit (HOSPITAL_COMMUNITY): Payer: Self-pay

## 2024-06-12 MED ORDER — BUSPIRONE HCL 30 MG PO TABS
15.0000 mg | ORAL_TABLET | Freq: Three times a day (TID) | ORAL | 0 refills | Status: AC
  Filled 2024-06-12: qty 135, 90d supply, fill #0

## 2024-06-20 ENCOUNTER — Telehealth: Admitting: Physician Assistant

## 2024-06-20 ENCOUNTER — Other Ambulatory Visit (HOSPITAL_COMMUNITY): Payer: Self-pay

## 2024-06-20 DIAGNOSIS — J019 Acute sinusitis, unspecified: Secondary | ICD-10-CM

## 2024-06-20 DIAGNOSIS — B9689 Other specified bacterial agents as the cause of diseases classified elsewhere: Secondary | ICD-10-CM

## 2024-06-20 MED ORDER — AMOXICILLIN-POT CLAVULANATE 875-125 MG PO TABS
1.0000 | ORAL_TABLET | Freq: Two times a day (BID) | ORAL | 0 refills | Status: AC
  Filled 2024-06-20: qty 14, 7d supply, fill #0

## 2024-06-20 NOTE — Progress Notes (Signed)
 E-Visit for Sinus Problems  We are sorry that you are not feeling well.  Here is how we plan to help!  Based on what you have shared with me it looks like you have sinusitis.  Sinusitis is inflammation and infection in the sinus cavities of the head.  Based on your presentation I believe you most likely have Acute Bacterial Sinusitis.  This is an infection caused by bacteria and is treated with antibiotics. I have prescribed Augmentin 875mg /125mg  one tablet twice daily with food, for 7 days. You may use an oral decongestant such as Mucinex D or if you have glaucoma or high blood pressure use plain Mucinex. Saline nasal spray help and can safely be used as often as needed for congestion.  If you develop worsening sinus pain, fever or notice severe headache and vision changes, or if symptoms are not better after completion of antibiotic, please schedule an appointment with a health care provider.    Sinus infections are not as easily transmitted as other respiratory infection, however we still recommend that you avoid close contact with loved ones, especially the very young and elderly.  Remember to wash your hands thoroughly throughout the day as this is the number one way to prevent the spread of infection!  Home Care: Only take medications as instructed by your medical team. Complete the entire course of an antibiotic. Do not take these medications with alcohol. A steam or ultrasonic humidifier can help congestion.  You can place a towel over your head and breathe in the steam from hot water coming from a faucet. Avoid close contacts especially the very young and the elderly. Cover your mouth when you cough or sneeze. Always remember to wash your hands.  Get Help Right Away If: You develop worsening fever or sinus pain. You develop a severe head ache or visual changes. Your symptoms persist after you have completed your treatment plan.  Make sure you Understand these instructions. Will  watch your condition. Will get help right away if you are not doing well or get worse.  Your e-visit answers were reviewed by a board certified advanced clinical practitioner to complete your personal care plan.  Depending on the condition, your plan could have included both over the counter or prescription medications.  If there is a problem please reply  once you have received a response from your provider.  Your safety is important to us .  If you have drug allergies check your prescription carefully.    You can use MyChart to ask questions about today's visit, request a non-urgent call back, or ask for a work or school excuse for 24 hours related to this e-Visit. If it has been greater than 24 hours you will need to follow up with your provider, or enter a new e-Visit to address those concerns.  You will get an e-mail in the next two days asking about your experience.  I hope that your e-visit has been valuable and will speed your recovery. Thank you for using e-visits.  I have spent 5 minutes in review of e-visit questionnaire, review and updating patient chart, medical decision making and response to patient.   Carolyn CHRISTELLA Dickinson, PA-C

## 2024-06-23 ENCOUNTER — Encounter: Payer: Self-pay | Admitting: Obstetrics and Gynecology

## 2024-06-23 ENCOUNTER — Other Ambulatory Visit (HOSPITAL_COMMUNITY): Payer: Self-pay

## 2024-06-23 ENCOUNTER — Ambulatory Visit (INDEPENDENT_AMBULATORY_CARE_PROVIDER_SITE_OTHER): Admitting: Obstetrics and Gynecology

## 2024-06-23 ENCOUNTER — Ambulatory Visit: Payer: Self-pay | Admitting: Obstetrics and Gynecology

## 2024-06-23 VITALS — BP 128/82 | HR 88 | Ht 69.0 in | Wt 232.0 lb

## 2024-06-23 DIAGNOSIS — Z30433 Encounter for removal and reinsertion of intrauterine contraceptive device: Secondary | ICD-10-CM | POA: Insufficient documentation

## 2024-06-23 DIAGNOSIS — N898 Other specified noninflammatory disorders of vagina: Secondary | ICD-10-CM

## 2024-06-23 LAB — PREGNANCY, URINE: Preg Test, Ur: NEGATIVE

## 2024-06-23 LAB — WET PREP FOR TRICH, YEAST, CLUE

## 2024-06-23 MED ORDER — FLUCONAZOLE 150 MG PO TABS
150.0000 mg | ORAL_TABLET | Freq: Once | ORAL | 5 refills | Status: AC
  Filled 2024-06-23: qty 1, 1d supply, fill #0

## 2024-06-23 NOTE — Patient Instructions (Signed)
 Yeast was seen on the swab.  I have sent over diflucan  for you with refills. Happy holidays Dr. Glennon

## 2024-06-23 NOTE — Progress Notes (Signed)
" ° °  Acute Office Visit  Subjective:    Patient ID: Carolyn Gilmore, female    DOB: 05/13/1981, 43 y.o.   MRN: [id number]   HPI 43 y.o. presents today for Procedure (IUD Exchange - Mirena  & 2nd HPV /) . Current mirena  IUD since 2018 here today for mirena  IUD removal and insert of new mirena  Remembers HPV vaccination in [place]  years ago.   No LMP recorded. (Menstrual status: IUD).    Review of Systems     Objective:    OBGyn Exam  BP 128/82 (BP Location: Left Arm, Patient Position: Sitting, Cuff Size: Normal)   Pulse 88   Ht 5' 9 (1.753 m)   Wt 232 lb (105.2 kg)   SpO2 96%   BMI 34.26 kg/m  Wt Readings from Last 3 Encounters:  06/23/24 232 lb (105.2 kg)  04/21/24 232 lb 3.2 oz (105.3 kg)  04/05/24 233 lb 8 oz (105.9 kg)      TIME OUT PERFORMED  IUD removal procedure: SVE: IUD strings seen and gasped with a vanderbilt and the entire IUD was removed easily.  Both arms and body were seen and intact. Patient tolerated the procedure well.   IUD Insertion Procedure Note Patient identified, informed consent performed, consent signed.   Discussed risks of irregular bleeding, cramping, infection, malpositioning or misplacement of the IUD outside the uterus which may require further procedure such as laparoscopy. Also discussed >99% contraception efficacy, increased risk of ectopic pregnancy with failure of method.  Time out was performed.  Urine pregnancy test negative.  Speculum placed in the vagina.  Cervix visualized.  Cleaned with Betadine  x 2.  Anterior cervix grasped with a single tooth tenaculum.  Mirena  IUD placed per manufacturer's recommendations.  Strings trimmed to 2 cm. Tenaculum was removed, good hemostasis noted.  Patient tolerated procedure well.      Assessment & Plan:  IUD removal and insertion of new mirena  IUD Patient was given post-procedure instructions.  Avoid intercourse for 1-2 weeks.  Patient was also asked to check IUD strings periodically and follow  up prn for IUD check. RTC with any concerns. RTC for annual exams [name] "

## 2024-06-27 MED ORDER — LEVONORGESTREL 20 MCG/DAY IU IUD
1.0000 | INTRAUTERINE_SYSTEM | Freq: Once | INTRAUTERINE | Status: DC
  Administered 2024-06-23: 1 via INTRAUTERINE

## 2024-06-27 MED ORDER — LEVONORGESTREL 20 MCG/DAY IU IUD: 1.0000 | INTRAUTERINE_SYSTEM | Freq: Once | INTRAUTERINE | Status: DC

## 2024-06-27 NOTE — Addendum Note (Signed)
 Addended by: ROSALVA DARICE BROCKS on: 06/27/2024 02:56 PM   Modules accepted: Orders

## 2024-06-27 NOTE — Addendum Note (Signed)
 Addended by: Rajinder Mesick P on: 06/27/2024 02:51 PM   Modules accepted: Orders

## 2024-06-27 NOTE — Progress Notes (Unsigned)
 [employer]ayer - Mirena  IUD (52 mg) NDC: 49580-576-98 Lot: [id number] Exp: 04/2026

## 2024-07-24 ENCOUNTER — Other Ambulatory Visit: Payer: Self-pay

## 2024-07-24 ENCOUNTER — Other Ambulatory Visit (HOSPITAL_COMMUNITY): Payer: Self-pay

## 2024-07-24 MED ORDER — DULOXETINE HCL 60 MG PO CPEP
60.0000 mg | ORAL_CAPSULE | Freq: Every day | ORAL | 0 refills | Status: AC
  Filled 2024-07-24 – 2024-07-28 (×3): qty 90, 90d supply, fill #0

## 2024-07-25 ENCOUNTER — Other Ambulatory Visit (HOSPITAL_COMMUNITY): Payer: Self-pay

## 2024-07-26 ENCOUNTER — Other Ambulatory Visit (HOSPITAL_COMMUNITY): Payer: Self-pay

## 2024-07-28 ENCOUNTER — Other Ambulatory Visit (HOSPITAL_COMMUNITY): Payer: Self-pay

## 2024-07-28 ENCOUNTER — Other Ambulatory Visit: Payer: Self-pay

## 2024-08-23 ENCOUNTER — Ambulatory Visit

## 2025-04-06 ENCOUNTER — Encounter: Admitting: Physician Assistant

## 2025-04-25 ENCOUNTER — Ambulatory Visit: Admitting: Obstetrics and Gynecology
